# Patient Record
Sex: Female | Born: 1950 | Race: Black or African American | Hispanic: No | Marital: Married | Smoking: Former smoker
Health system: Southern US, Community
[De-identification: ages and names within clinical notes are randomized; demographics above are authoritative.]

## PROBLEM LIST (undated history)

## (undated) DIAGNOSIS — M549 Dorsalgia, unspecified: Secondary | ICD-10-CM

## (undated) DIAGNOSIS — G4730 Sleep apnea, unspecified: Secondary | ICD-10-CM

## (undated) DIAGNOSIS — T8859XA Other complications of anesthesia, initial encounter: Secondary | ICD-10-CM

## (undated) DIAGNOSIS — M1990 Unspecified osteoarthritis, unspecified site: Secondary | ICD-10-CM

## (undated) DIAGNOSIS — I10 Essential (primary) hypertension: Secondary | ICD-10-CM

## (undated) DIAGNOSIS — E785 Hyperlipidemia, unspecified: Secondary | ICD-10-CM

## (undated) HISTORY — PX: BREAST EXCISIONAL BIOPSY: SUR124

## (undated) HISTORY — PX: BACK SURGERY: SHX140

---

## 2007-09-07 ENCOUNTER — Encounter: Admission: RE | Admit: 2007-09-07 | Discharge: 2007-09-07 | Payer: Self-pay | Admitting: *Deleted

## 2007-10-14 ENCOUNTER — Encounter: Admission: RE | Admit: 2007-10-14 | Discharge: 2007-10-14 | Payer: Self-pay | Admitting: Interventional Cardiology

## 2007-10-31 ENCOUNTER — Encounter: Admission: RE | Admit: 2007-10-31 | Discharge: 2007-10-31 | Payer: Self-pay | Admitting: Obstetrics and Gynecology

## 2007-12-07 ENCOUNTER — Encounter: Admission: RE | Admit: 2007-12-07 | Discharge: 2007-12-07 | Payer: Self-pay | Admitting: Gastroenterology

## 2008-04-23 ENCOUNTER — Other Ambulatory Visit: Admission: RE | Admit: 2008-04-23 | Discharge: 2008-04-23 | Payer: Self-pay | Admitting: Obstetrics and Gynecology

## 2008-10-25 ENCOUNTER — Encounter: Admission: RE | Admit: 2008-10-25 | Discharge: 2008-10-25 | Payer: Self-pay | Admitting: Pediatrics

## 2009-11-11 ENCOUNTER — Encounter: Admission: RE | Admit: 2009-11-11 | Discharge: 2009-11-11 | Payer: Self-pay | Admitting: Pediatrics

## 2009-11-27 IMAGING — US US SOFT TISSUE HEAD/NECK
1 series · 14 of 25 positions shown · non-contrast
Comparison: none

CLINICAL DATA: Follow-up left thyroid nodule noted on CT of the chest on 10/14/07.
 THYROID ULTRASOUND:
TECHNIQUE: Ultrasound examination of the thyroid gland and adjacent soft tissue structures was performed.

[Series 1: us soft tissue head/neck · 0.09mm/px · 14 of 51 slices shown]
[im 1/51]
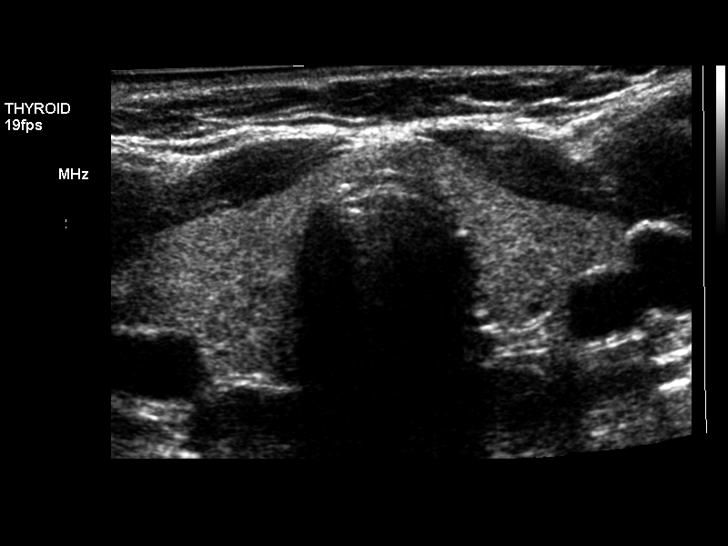
[im 5/51]
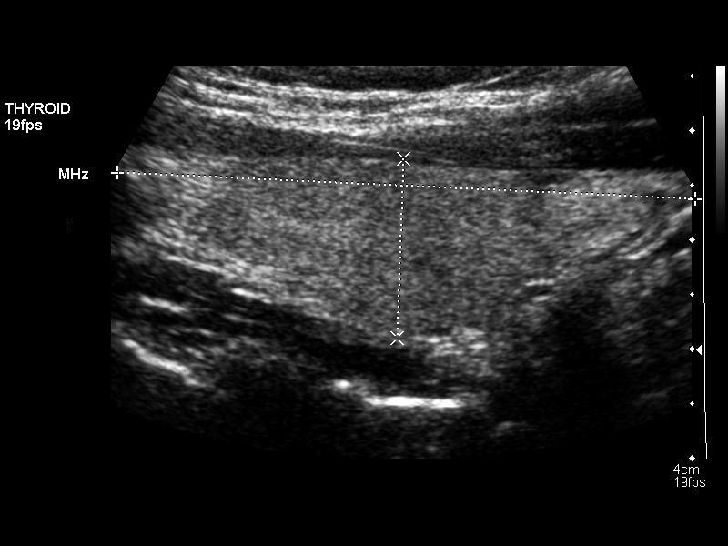
[im 9/51]
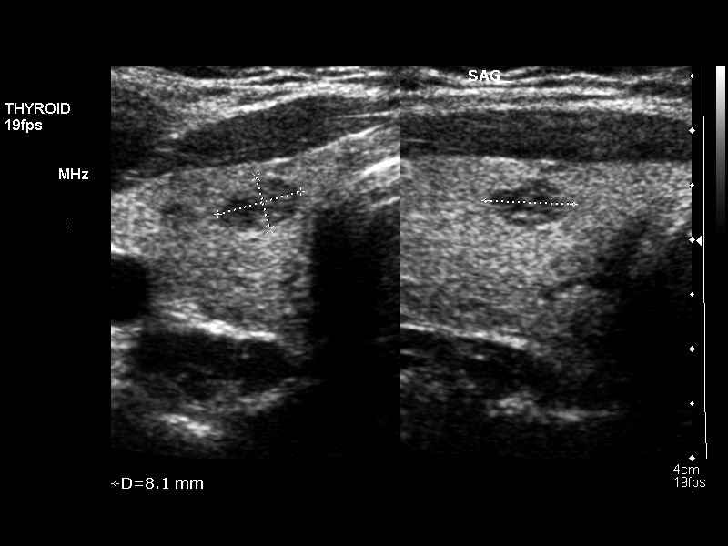
[im 13/51]
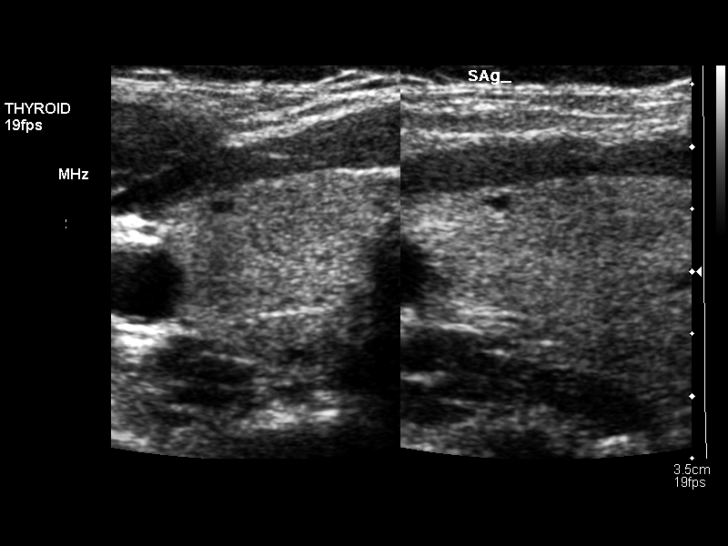
[im 17/51]
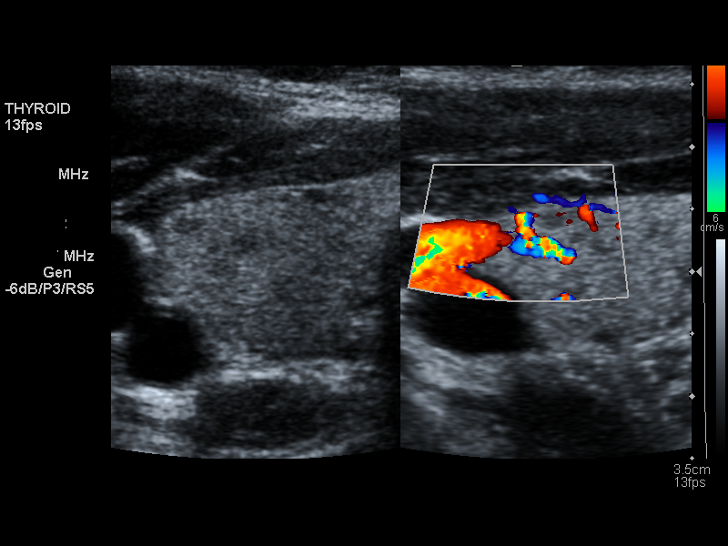
[im 19/51]
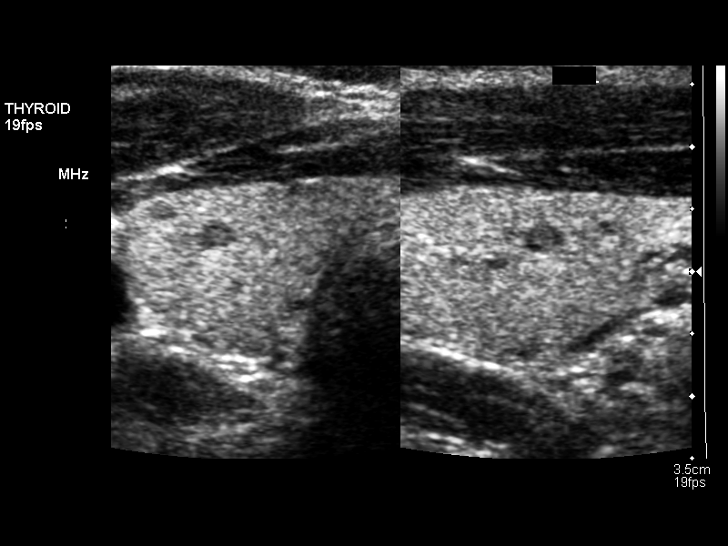
[im 23/51]
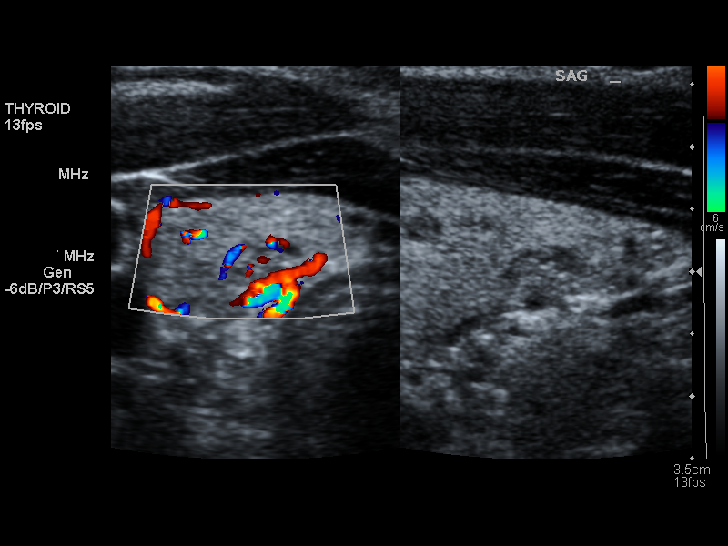
[im 28/51]
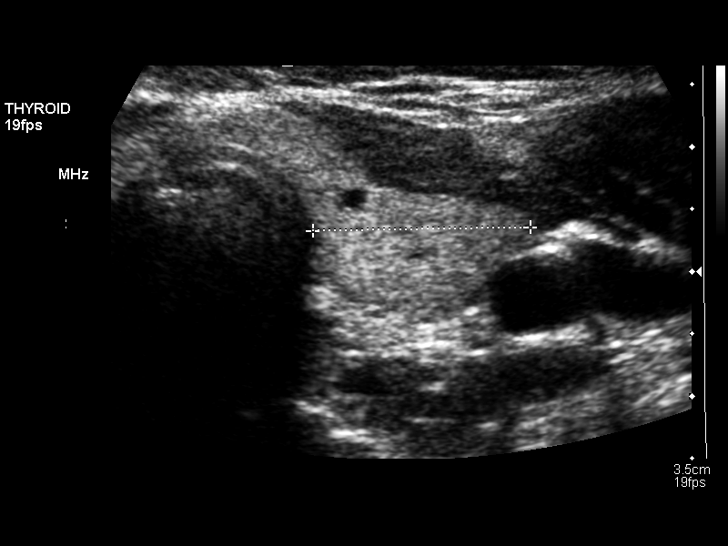
[im 32/51]
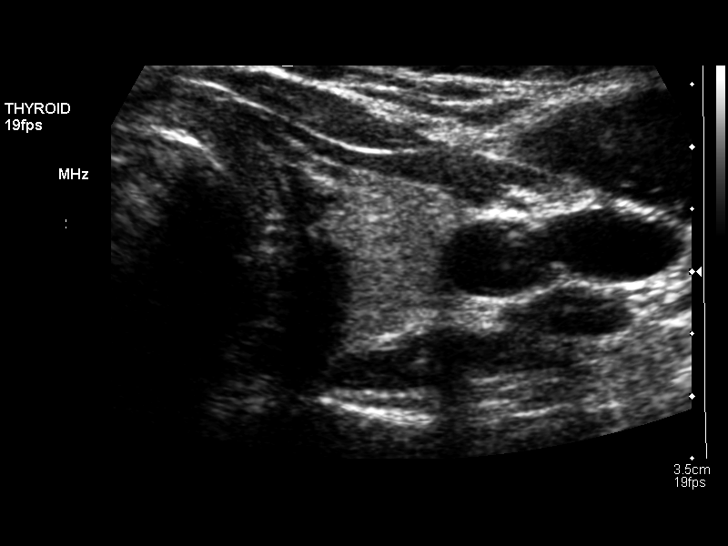
[im 34/51]
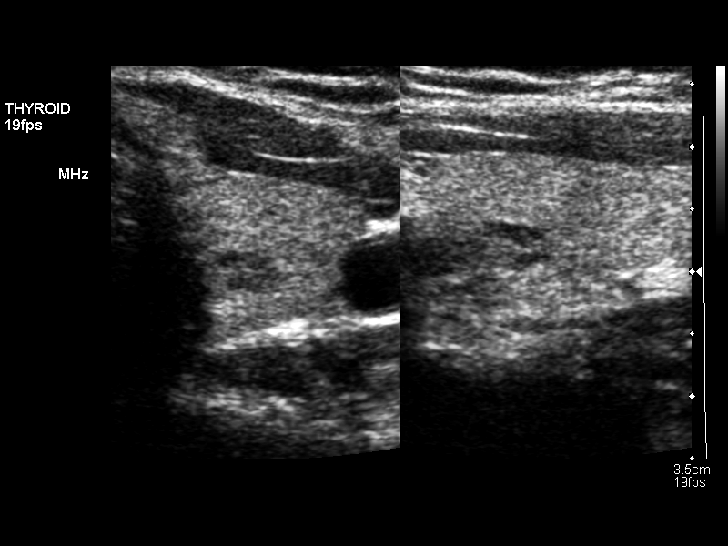
[im 38/51]
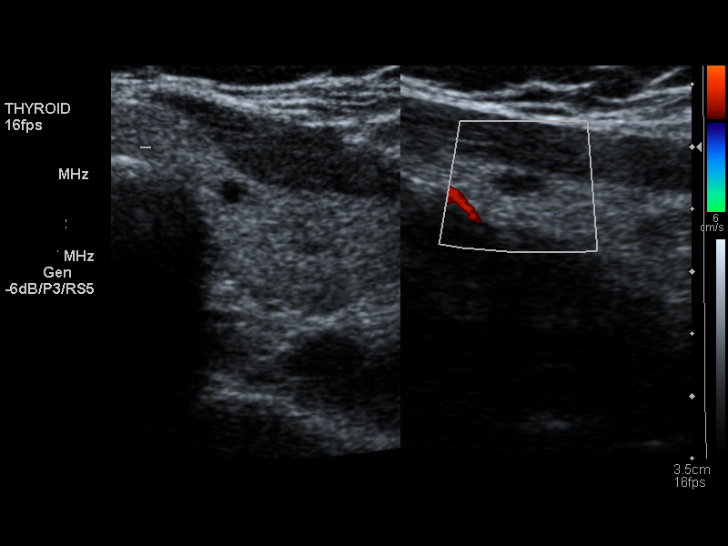
[im 42/51]
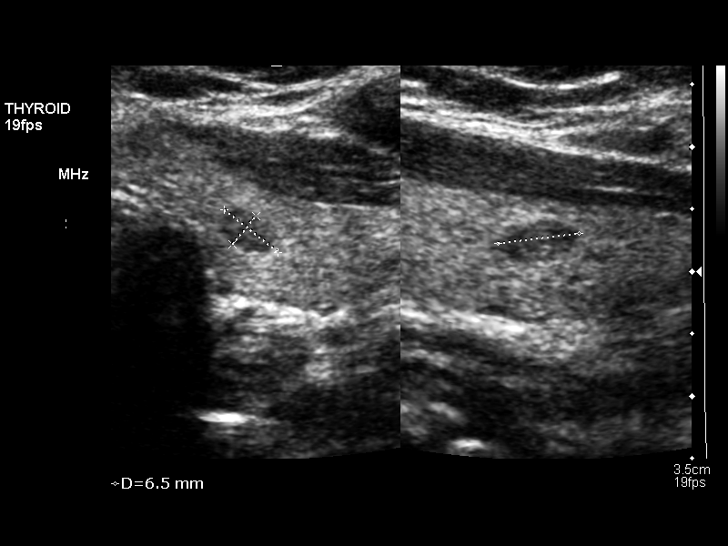
[im 46/51]
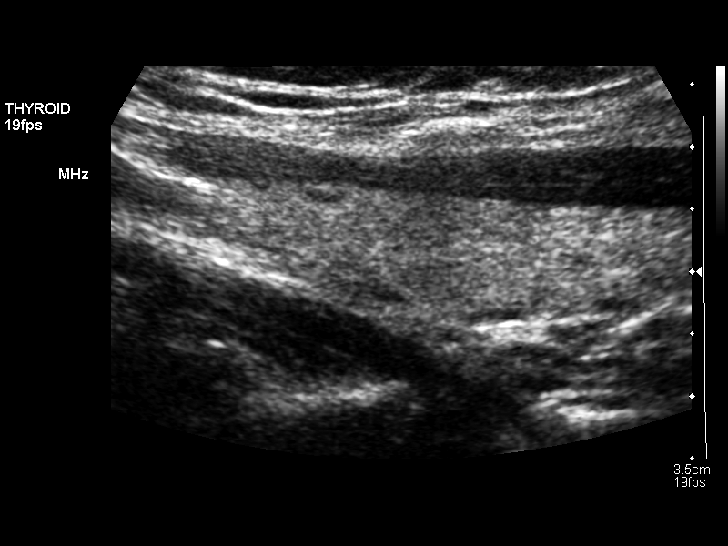
[im 51/51]
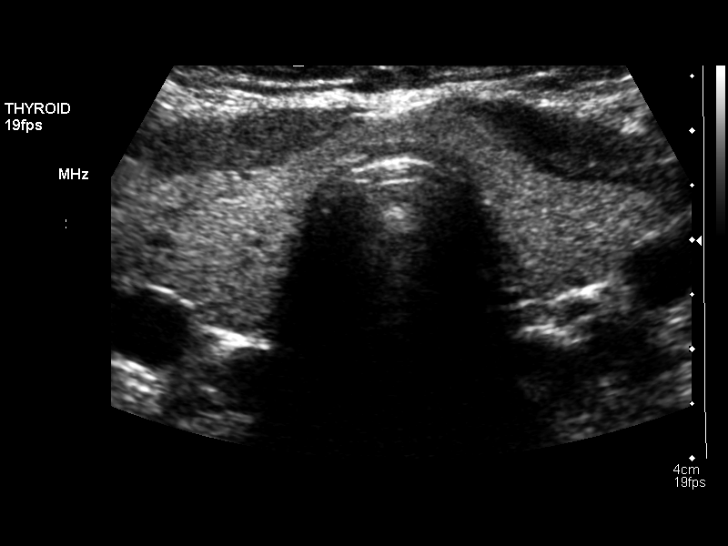

[14 of 25 positions shown; findings below may reference images not displayed]

FINDINGS: A low attenuation nodule was noted in the lower pole of the left thyroid on CT of the chest.  The thyroid is within normal limits in size.  The right lobe measures 5.3 cm sagittally with a depth of 1.6 cm and width of 1.9 cm.  The left lobe measures 4.7 x 0.9 x 1.7 cm with isthmus measuring 3.5 mm.  Small nodules are noted bilaterally.  One of the larger nodules is in the lower pole on the right and is solid measuring 8 x 5 x 8 mm with another nodule in the right mid lobe of 8 x 5 x 8 mm.  Another nodule, which is solid, is in the lower pole on the left of 6 x 3 x 7 with smaller nodules scattered bilaterally.
IMPRESSION: Multiple small nodules, none larger than 8 mm.  The thyroid gland is within normal limits in size.

## 2010-12-10 ENCOUNTER — Other Ambulatory Visit: Payer: Self-pay | Admitting: Pediatrics

## 2010-12-10 DIAGNOSIS — Z1231 Encounter for screening mammogram for malignant neoplasm of breast: Secondary | ICD-10-CM

## 2010-12-11 ENCOUNTER — Ambulatory Visit
Admission: RE | Admit: 2010-12-11 | Discharge: 2010-12-11 | Disposition: A | Discharge status: Home / Self Care | Payer: BC Managed Care – PPO | Source: Ambulatory Visit | Attending: Pediatrics | Admitting: Pediatrics

## 2010-12-11 DIAGNOSIS — Z1231 Encounter for screening mammogram for malignant neoplasm of breast: Secondary | ICD-10-CM

## 2012-01-27 ENCOUNTER — Other Ambulatory Visit: Payer: Self-pay | Admitting: Pediatrics

## 2012-01-27 DIAGNOSIS — Z1231 Encounter for screening mammogram for malignant neoplasm of breast: Secondary | ICD-10-CM

## 2012-02-08 ENCOUNTER — Ambulatory Visit: Payer: BC Managed Care – PPO

## 2012-02-09 ENCOUNTER — Ambulatory Visit
Admission: RE | Admit: 2012-02-09 | Discharge: 2012-02-09 | Disposition: A | Discharge status: Home / Self Care | Payer: BC Managed Care – PPO | Source: Ambulatory Visit | Attending: Pediatrics | Admitting: Pediatrics

## 2012-02-09 DIAGNOSIS — Z1231 Encounter for screening mammogram for malignant neoplasm of breast: Secondary | ICD-10-CM

## 2012-11-08 ENCOUNTER — Encounter (HOSPITAL_COMMUNITY): Payer: Self-pay | Admitting: *Deleted

## 2012-11-08 ENCOUNTER — Emergency Department (HOSPITAL_COMMUNITY): Payer: BC Managed Care – PPO

## 2012-11-08 ENCOUNTER — Emergency Department (HOSPITAL_COMMUNITY)
Admission: EM | Admit: 2012-11-08 | Discharge: 2012-11-08 | Disposition: A | Discharge status: Home / Self Care | Payer: BC Managed Care – PPO | Attending: Emergency Medicine | Admitting: Emergency Medicine

## 2012-11-08 DIAGNOSIS — S82899A Other fracture of unspecified lower leg, initial encounter for closed fracture: Secondary | ICD-10-CM | POA: Insufficient documentation

## 2012-11-08 DIAGNOSIS — S8261XA Displaced fracture of lateral malleolus of right fibula, initial encounter for closed fracture: Secondary | ICD-10-CM

## 2012-11-08 DIAGNOSIS — Y929 Unspecified place or not applicable: Secondary | ICD-10-CM | POA: Insufficient documentation

## 2012-11-08 DIAGNOSIS — W010XXA Fall on same level from slipping, tripping and stumbling without subsequent striking against object, initial encounter: Secondary | ICD-10-CM | POA: Insufficient documentation

## 2012-11-08 DIAGNOSIS — F17200 Nicotine dependence, unspecified, uncomplicated: Secondary | ICD-10-CM | POA: Insufficient documentation

## 2012-11-08 DIAGNOSIS — Y9301 Activity, walking, marching and hiking: Secondary | ICD-10-CM | POA: Insufficient documentation

## 2012-11-08 MED ORDER — HYDROCODONE-ACETAMINOPHEN 5-325 MG PO TABS
1.0000 | ORAL_TABLET | Freq: Once | ORAL | Status: AC
  Administered 2012-11-08: 1 via ORAL
  Filled 2012-11-08: qty 1

## 2012-11-08 MED ORDER — HYDROCODONE-ACETAMINOPHEN 5-325 MG PO TABS: 1.0000 | ORAL_TABLET | ORAL | Status: DC | PRN

## 2012-11-08 NOTE — ED Notes (Signed)
Pt states that she was walking on the sidewalk and she "slipped on the ice".  She reports hearing a "pop" sound and was unable to ambulate post fall.  + swelling noted to R ankle.  Ice pack applied.

## 2012-11-08 NOTE — ED Notes (Signed)
Valerie Chung with Ortho on her way.

## 2012-11-08 NOTE — ED Notes (Signed)
Patient transported to X-ray 

## 2012-11-08 NOTE — Progress Notes (Signed)
Orthopedic Tech Progress Note Patient Details:  Valerie Chung [birth year] [id number]  Ortho Devices Type of Ortho Device: Ace wrap;Stirrup splint;Short leg splint Ortho Device/Splint Location: RIGHT POSTERIOR WITH STIRRUP SPLINT AND CRUTCHES Ortho Device/Splint Interventions: Application   Cammer, Mickie Bail 11/08/2012, 9:19 AM

## 2012-11-08 NOTE — ED Notes (Signed)
Ortho tec paged 

## 2012-11-08 NOTE — ED Notes (Signed)
Ortho tec at bedside 

## 2012-11-08 NOTE — ED Provider Notes (Addendum)
History     CSN: [id number]  Arrival date & time 11/08/12  0717   First MD Initiated Contact with Patient 11/08/12 0719      Chief Complaint  Patient presents with  . Ankle Pain    (Consider location/radiation/quality/duration/timing/severity/associated sxs/prior treatment) Patient is a 62 y.o. female presenting with ankle pain. The history is provided by the patient. No language interpreter was used.  Ankle Pain Location:  Ankle Time since incident:  1 hour Injury: yes   Mechanism of injury: fall   Mechanism of injury comment:  Pt slipped on ice and fell, injuring her right ankle.  She was unable to stand and walk afterwards. Fall:    Impact surface:  Concrete   Point of impact: Twisted her right ankle.   Entrapped after fall: no   Pain details:    Quality:  Aching   Radiates to:  Does not radiate   Severity:  Moderate   Onset quality:  Sudden   Duration:  1 hour   Timing:  Constant   Progression:  Unchanged Chronicity:  New Dislocation: no   Foreign body present:  No foreign bodies Prior injury to area:  No Relieved by:  Nothing Worsened by:  Nothing tried   No past medical history on file.  No past surgical history on file.  No family history on file.  History  Substance Use Topics  . Smoking status: Light Tobacco Smoker  . Smokeless tobacco: Not on file  . Alcohol Use: Yes     Comment: occasional    OB History   Grav Para Term Preterm Abortions TAB SAB Ect Mult Living                  Review of Systems  All other systems reviewed and are negative.    Allergies  Review of patient's allergies indicates not on file.  Home Medications  No current outpatient prescriptions on file.  BP 157/92  Pulse 65  Temp(Src) 98.1 F (36.7 C) (Oral)  Physical Exam  Nursing note and vitals reviewed. Constitutional: She appears well-developed and well-nourished. Distressed: in moderate distress with right ankle pain.  Musculoskeletal:  He has swelling  over the lateral malleolus of the right ankle. Skin is intact she has intact pulses sensation and tendon function in the right foot.  Skin: Skin is warm and dry.  Psychiatric: She has a normal mood and affect. Her behavior is normal.    ED Course  Procedures (including critical care time)  7:35 AM Patient was seen and had physical examination. X-rays the right ankle were ordered. The patient did not want pain medication at present.  8:28 AM No results found for this or any previous visit. Dg Ankle Complete Right  11/08/2012  *RADIOLOGY REPORT*  Clinical Data: History of injury from fall with pain.  Soft tissue swelling involving lateral aspect.  RIGHT ANKLE - COMPLETE 3+ VIEW  Comparison: None.  Findings: There is lateral soft tissue swelling.  Oblique fracture of the distal diaphysis and metaphysis of the fibula is present. There is very slight displacement laterally of the distal fracture fragment.  Alignment is near anatomic.  There is near apposition at the fracture site.  Mortise is preserved.  There is degenerative spurring of the distal aspect of the tibia.  Posterior and plantar calcaneal spurring is present.  There is irregularity of the distal and lateral aspect of the medial malleolus of the distal tibia which consistent with a tiny chip fracture.  IMPRESSION:  Fracture of distal metaphysis and diaphysis of fibula.  Tiny chip fracture of medial malleolus of distal tibia.  No dislocation evident.  Degenerative spurring of distal tibia.  Calcaneal spurring.   Original Report Authenticated By: Onalee Hua Call     X-rays show nondisplaced fracture of the right lateral malleolus.  Rx splint, crutches, hydrocodone-acetaminophen q4h prn pain. F/U with [facility], orthopedists of her choice.    1. Fracture of lateral malleolus of right ankle, closed, initial encounter           Carleene Cooper III, MD 11/08/12 1610     Carleene Cooper III, MD 11/29/12 1901

## 2013-01-19 ENCOUNTER — Other Ambulatory Visit: Payer: Self-pay | Admitting: Obstetrics and Gynecology

## 2013-01-19 ENCOUNTER — Other Ambulatory Visit (HOSPITAL_COMMUNITY)
Admission: RE | Admit: 2013-01-19 | Discharge: 2013-01-19 | Disposition: A | Discharge status: Home / Self Care | Payer: BC Managed Care – PPO | Source: Ambulatory Visit | Attending: Obstetrics and Gynecology | Admitting: Obstetrics and Gynecology

## 2013-01-19 DIAGNOSIS — Z1151 Encounter for screening for human papillomavirus (HPV): Secondary | ICD-10-CM | POA: Insufficient documentation

## 2013-01-19 DIAGNOSIS — Z01419 Encounter for gynecological examination (general) (routine) without abnormal findings: Secondary | ICD-10-CM | POA: Insufficient documentation

## 2013-03-14 ENCOUNTER — Other Ambulatory Visit: Payer: Self-pay

## 2013-04-18 ENCOUNTER — Other Ambulatory Visit: Payer: Self-pay

## 2013-04-18 DIAGNOSIS — Z1231 Encounter for screening mammogram for malignant neoplasm of breast: Secondary | ICD-10-CM

## 2013-05-05 ENCOUNTER — Ambulatory Visit
Admission: RE | Admit: 2013-05-05 | Discharge: 2013-05-05 | Disposition: A | Discharge status: Home / Self Care | Payer: BC Managed Care – PPO | Source: Ambulatory Visit

## 2013-05-05 DIAGNOSIS — Z1231 Encounter for screening mammogram for malignant neoplasm of breast: Secondary | ICD-10-CM

## 2014-04-06 ENCOUNTER — Other Ambulatory Visit: Payer: Self-pay | Admitting: Family Medicine

## 2014-04-06 ENCOUNTER — Ambulatory Visit
Admission: RE | Admit: 2014-04-06 | Discharge: 2014-04-06 | Disposition: A | Discharge status: Home / Self Care | Payer: PRIVATE HEALTH INSURANCE | Source: Ambulatory Visit | Attending: Family Medicine | Admitting: Family Medicine

## 2014-04-06 DIAGNOSIS — R519 Headache, unspecified: Secondary | ICD-10-CM

## 2014-04-06 DIAGNOSIS — R51 Headache: Principal | ICD-10-CM

## 2014-07-03 ENCOUNTER — Other Ambulatory Visit: Payer: Self-pay

## 2014-07-03 DIAGNOSIS — Z1239 Encounter for other screening for malignant neoplasm of breast: Secondary | ICD-10-CM

## 2014-07-04 ENCOUNTER — Encounter (INDEPENDENT_AMBULATORY_CARE_PROVIDER_SITE_OTHER): Payer: Self-pay

## 2014-07-04 ENCOUNTER — Ambulatory Visit
Admission: RE | Admit: 2014-07-04 | Discharge: 2014-07-04 | Disposition: A | Discharge status: Home / Self Care | Payer: PRIVATE HEALTH INSURANCE | Source: Ambulatory Visit

## 2014-07-04 DIAGNOSIS — Z1239 Encounter for other screening for malignant neoplasm of breast: Secondary | ICD-10-CM

## 2014-11-26 ENCOUNTER — Emergency Department (INDEPENDENT_AMBULATORY_CARE_PROVIDER_SITE_OTHER): Payer: PRIVATE HEALTH INSURANCE

## 2014-11-26 ENCOUNTER — Encounter: Payer: Self-pay | Admitting: Emergency Medicine

## 2014-11-26 ENCOUNTER — Emergency Department (INDEPENDENT_AMBULATORY_CARE_PROVIDER_SITE_OTHER)
Admission: EM | Admit: 2014-11-26 | Discharge: 2014-11-26 | Disposition: A | Discharge status: Home / Self Care | Payer: PRIVATE HEALTH INSURANCE | Source: Home / Self Care | Attending: Family Medicine | Admitting: Family Medicine

## 2014-11-26 DIAGNOSIS — M545 Low back pain, unspecified: Secondary | ICD-10-CM

## 2014-11-26 DIAGNOSIS — M5417 Radiculopathy, lumbosacral region: Secondary | ICD-10-CM

## 2014-11-26 HISTORY — DX: Dorsalgia, unspecified: M54.9

## 2014-11-26 MED ORDER — PREDNISONE 20 MG PO TABS: 20.0000 mg | ORAL_TABLET | Freq: Two times a day (BID) | ORAL | Status: DC

## 2014-11-26 MED ORDER — KETOROLAC TROMETHAMINE 60 MG/2ML IM SOLN: 60.0000 mg | Freq: Once | INTRAMUSCULAR | Status: DC

## 2014-11-26 MED ORDER — HYDROCODONE-ACETAMINOPHEN 5-325 MG PO TABS: 1.0000 | ORAL_TABLET | Freq: Four times a day (QID) | ORAL | Status: DC | PRN

## 2014-11-26 NOTE — ED Notes (Signed)
Patient reports onset of sharp pains in right hip without injury/incident yesterday; had previously had sciatic nerve pain in left hip and feel this is similar; had Mobic and this did not help; finds walking very painful and came in on crutches.

## 2014-11-26 NOTE — ED Provider Notes (Signed)
CSN: [id number]     Arrival date & time 11/26/14  1117 History   First MD Initiated Contact with Patient 11/26/14 1232     Chief Complaint  Patient presents with  . Hip Pain      HPI Comments: Three days ago patient developed sharp aching pain in her left buttock that intermittently radiated to her left posterior thigh and calf, with occasional tingling in her left foot/ankle.  Yesterday she developed similar pain in her right buttock radiating to her right posterior thigh and calf.  The pain is somewhat better when leaning forward.  She denies bowel or bladder dysfunction, and no saddle numbness.  Her pain has not responded to Mobic.  She recalls no recent injury or change in physical activities. She has a history of bulging disc at L4-5.  Four years ago her similar back pain resolved after epidural steroid injection by her neurologist.       Patient is a 64 y.o. female presenting with back pain. The history is provided by the patient and the spouse.  Back Pain Location:  Lumbar spine and gluteal region Quality:  Stabbing and aching Radiates to:  L posterior upper leg, R posterior upper leg, L knee and R knee Pain severity:  Moderate Pain is:  Same all the time Onset quality:  Gradual Duration:  4 days Timing:  Constant Progression:  Worsening Chronicity:  Recurrent Context: not lifting heavy objects, not recent illness and not recent injury   Relieved by:  Nothing Worsened by:  Ambulation, bending and twisting Ineffective treatments:  NSAIDs Associated symptoms: leg pain, paresthesias and tingling   Associated symptoms: no abdominal pain, no abdominal swelling, no bladder incontinence, no bowel incontinence, no dysuria, no fever, no headaches, no numbness, no pelvic pain, no perianal numbness, no weakness and no weight loss   Risk factors: obesity     Past Medical History  Diagnosis Date  . Back pain     low   Past Surgical History  Procedure Laterality Date  . Back surgery      Family History  Problem Relation Age of Onset  . Cancer Mother    History  Substance Use Topics  . Smoking status: Former Games developer  . Smokeless tobacco: Not on file  . Alcohol Use: Yes     Comment: occasional   OB History    No data available     Review of Systems  Constitutional: Negative for fever and weight loss.  Gastrointestinal: Negative for abdominal pain and bowel incontinence.  Genitourinary: Negative for bladder incontinence, dysuria and pelvic pain.  Musculoskeletal: Positive for back pain.  Neurological: Positive for tingling and paresthesias. Negative for weakness, numbness and headaches.  All other systems reviewed and are negative.   Allergies  Review of patient's allergies indicates no known allergies.  Home Medications   Prior to Admission medications   Medication Sig Start Date End Date Taking? Authorizing Provider  aspirin 81 MG tablet Take 81 mg by mouth daily.   Yes Historical Provider, MD  meloxicam (MOBIC) 15 MG tablet Take 15 mg by mouth as needed for pain.   Yes Historical Provider, MD  HYDROcodone-acetaminophen (NORCO/VICODIN) 5-325 MG per tablet Take 1 tablet by mouth every 6 (six) hours as needed. 11/26/14   Lattie Haw, MD  predniSONE (DELTASONE) 20 MG tablet Take 1 tablet (20 mg total) by mouth 2 (two) times daily. Take with food. 11/26/14   Lattie Haw, MD   BP 128/84 mmHg  Pulse  68  Temp(Src) 98.2 F (36.8 C) (Oral)  Resp 16  Ht 5\' 9"  (1.753 m)  Wt 235 lb (106.595 kg)  BMI 34.69 kg/m2  SpO2 97% Physical Exam  Constitutional: She is oriented to person, place, and time. She appears well-developed and well-nourished. No distress.  Patient is obese (BMI 34.7).  She has difficulty ambulating, using crutches  HENT:  Head: Normocephalic.  Mouth/Throat: Oropharynx is clear and moist.  Eyes: Conjunctivae are normal. Pupils are equal, round, and reactive to light.  Neck: Normal range of motion.  Cardiovascular: Normal heart sounds.     Pulmonary/Chest: Breath sounds normal.  Abdominal: Bowel sounds are normal. There is no tenderness.  Musculoskeletal:       Lumbar back: She exhibits tenderness and bony tenderness.       Back:  Back:   Decreased range of motion.  She is able to squat.  Tenderness in the midline and bilateral paraspinous muscles from L3 to Sacral area; also tenderness to palpation over SI joints.  Straight leg raising test is positive on the right at 60 degrees.  Sitting knee extension test is negative.  Strength and sensation in the lower extremities is normal.  Patellar and achilles reflexes are normal in the left lower extremity.  Right patellar reflex is elicited with augmentation.  Neurological: She is alert and oriented to person, place, and time.  Skin: Skin is warm and dry.  Nursing note and vitals reviewed.   ED Course  Procedures  none    Imaging Review Dg Lumbar Spine Complete  11/26/2014   CLINICAL DATA:  Low back pain for 3 days with bilateral radiculopathy  EXAM: LUMBAR SPINE - COMPLETE 4+ VIEW  COMPARISON:  None.  FINDINGS: Five views of lumbar spine submitted. No acute fracture or subluxation. Minimal dextroscoliosis. There is mild disc space flattening with mild anterior spurring at L4-L5 and L5-S1 level. Facet degenerative changes L4 and L5 level.  IMPRESSION: No acute fracture or subluxation. Mild disc space flattening at L4-L5 and L5-S1 level.   Electronically Signed   By: Natasha MeadLiviu  Pop M.D.   On: 11/26/2014 13:30     MDM   1. Radiculopathy of lumbosacral region   2. Acute low back pain    Toradol 60mg  IM Begin prednisone burst.  Lortab for pain. Apply ice pack for 20 to 30 minutes, 3 to 4 times daily  Continue until pain decreases.  Avoid lifting. Followup with neurologist or Dr. Rodney Langtonhomas Thekkekandam as soon as possible.     Lattie HawStephen A Beese, MD 11/27/14 [phone]

## 2014-11-26 NOTE — Discharge Instructions (Signed)
Apply ice pack for 20 to 30 minutes, 3 to 4 times daily  Continue until pain decreases.  Avoid lifting.   Lumbosacral Radiculopathy Lumbosacral radiculopathy is a pinched nerve or nerves in the low back (lumbosacral area). When this happens you may have weakness in your legs and may not be able to stand on your toes. You may have pain going down into your legs. There may be difficulties with walking normally. There are many causes of this problem. Sometimes this may happen from an injury, or simply from arthritis or boney problems. It may also be caused by other illnesses such as diabetes. If there is no improvement after treatment, further studies may be done to find the exact cause. DIAGNOSIS  X-rays may be needed if the problems become long standing. Electromyograms may be done. This study is one in which the working of nerves and muscles is studied. HOME CARE INSTRUCTIONS   Applications of ice packs may be helpful. Ice can be used in a plastic bag with a towel around it to prevent frostbite to skin. This may be used every 2 hours for 20 to 30 minutes, or as needed, while awake, or as directed by your caregiver.  Only take over-the-counter or prescription medicines for pain, discomfort, or fever as directed by your caregiver.  If physical therapy was prescribed, follow your caregiver's directions. SEEK IMMEDIATE MEDICAL CARE IF:   You have pain not controlled with medications.  You seem to be getting worse rather than better.  You develop increasing weakness in your legs.  You develop loss of bowel or bladder control.  You have difficulty with walking or balance, or develop clumsiness in the use of your legs.  You have a fever. MAKE SURE YOU:   Understand these instructions.  Will watch your condition.  Will get help right away if you are not doing well or get worse. Document Released: 09/07/2005 Document Revised: 11/30/2011 Document Reviewed: 04/27/2008 [facility] Patient  Information 2015 [place], [place]. This information is not intended to replace advice given to you by your health care provider. Make sure you discuss any questions you have with your health care provider.

## 2015-01-28 ENCOUNTER — Other Ambulatory Visit (HOSPITAL_COMMUNITY): Payer: Self-pay | Admitting: Obstetrics and Gynecology

## 2015-01-28 ENCOUNTER — Other Ambulatory Visit (HOSPITAL_COMMUNITY)
Admission: RE | Admit: 2015-01-28 | Discharge: 2015-01-28 | Disposition: A | Discharge status: Home / Self Care | Payer: PRIVATE HEALTH INSURANCE | Source: Ambulatory Visit | Attending: Obstetrics and Gynecology | Admitting: Obstetrics and Gynecology

## 2015-01-28 ENCOUNTER — Other Ambulatory Visit: Payer: Self-pay | Admitting: Obstetrics and Gynecology

## 2015-01-28 DIAGNOSIS — Z01419 Encounter for gynecological examination (general) (routine) without abnormal findings: Secondary | ICD-10-CM | POA: Diagnosis not present

## 2015-01-28 DIAGNOSIS — N644 Mastodynia: Secondary | ICD-10-CM

## 2015-01-30 LAB — CYTOLOGY - PAP

## 2015-02-07 ENCOUNTER — Other Ambulatory Visit: Payer: PRIVATE HEALTH INSURANCE

## 2015-02-15 ENCOUNTER — Ambulatory Visit
Admission: RE | Admit: 2015-02-15 | Discharge: 2015-02-15 | Disposition: A | Discharge status: Home / Self Care | Payer: PRIVATE HEALTH INSURANCE | Source: Ambulatory Visit | Attending: Obstetrics and Gynecology | Admitting: Obstetrics and Gynecology

## 2015-02-15 DIAGNOSIS — N644 Mastodynia: Secondary | ICD-10-CM

## 2015-06-24 ENCOUNTER — Other Ambulatory Visit: Payer: Self-pay

## 2015-06-24 DIAGNOSIS — Z1231 Encounter for screening mammogram for malignant neoplasm of breast: Secondary | ICD-10-CM

## 2015-07-11 ENCOUNTER — Ambulatory Visit
Admission: RE | Admit: 2015-07-11 | Discharge: 2015-07-11 | Disposition: A | Discharge status: Home / Self Care | Payer: PRIVATE HEALTH INSURANCE | Source: Ambulatory Visit

## 2015-07-11 DIAGNOSIS — Z1231 Encounter for screening mammogram for malignant neoplasm of breast: Secondary | ICD-10-CM

## 2016-04-09 ENCOUNTER — Other Ambulatory Visit (HOSPITAL_COMMUNITY)
Admission: RE | Admit: 2016-04-09 | Discharge: 2016-04-09 | Disposition: A | Discharge status: Home / Self Care | Payer: Medicare Other | Source: Ambulatory Visit | Attending: Obstetrics and Gynecology | Admitting: Obstetrics and Gynecology

## 2016-04-09 ENCOUNTER — Other Ambulatory Visit: Payer: Self-pay | Admitting: Obstetrics and Gynecology

## 2016-04-09 DIAGNOSIS — Z01419 Encounter for gynecological examination (general) (routine) without abnormal findings: Secondary | ICD-10-CM | POA: Insufficient documentation

## 2016-04-09 DIAGNOSIS — Z1151 Encounter for screening for human papillomavirus (HPV): Secondary | ICD-10-CM | POA: Diagnosis present

## 2016-04-10 ENCOUNTER — Other Ambulatory Visit: Payer: Self-pay | Admitting: Obstetrics and Gynecology

## 2016-04-10 DIAGNOSIS — N644 Mastodynia: Secondary | ICD-10-CM

## 2016-04-13 LAB — CYTOLOGY - PAP

## 2016-04-15 ENCOUNTER — Other Ambulatory Visit: Payer: PRIVATE HEALTH INSURANCE

## 2016-04-22 ENCOUNTER — Ambulatory Visit
Admission: RE | Admit: 2016-04-22 | Discharge: 2016-04-22 | Disposition: A | Discharge status: Home / Self Care | Payer: Medicare Other | Source: Ambulatory Visit | Attending: Obstetrics and Gynecology | Admitting: Obstetrics and Gynecology

## 2016-04-22 DIAGNOSIS — N644 Mastodynia: Secondary | ICD-10-CM

## 2016-05-18 ENCOUNTER — Other Ambulatory Visit: Payer: Self-pay | Admitting: Obstetrics and Gynecology

## 2016-05-18 DIAGNOSIS — Z1231 Encounter for screening mammogram for malignant neoplasm of breast: Secondary | ICD-10-CM

## 2016-07-13 ENCOUNTER — Ambulatory Visit
Admission: RE | Admit: 2016-07-13 | Discharge: 2016-07-13 | Disposition: A | Discharge status: Home / Self Care | Payer: Medicare Other | Source: Ambulatory Visit | Attending: Obstetrics and Gynecology | Admitting: Obstetrics and Gynecology

## 2016-07-13 DIAGNOSIS — Z1231 Encounter for screening mammogram for malignant neoplasm of breast: Secondary | ICD-10-CM

## 2016-10-30 DIAGNOSIS — E785 Hyperlipidemia, unspecified: Secondary | ICD-10-CM | POA: Insufficient documentation

## 2016-10-30 DIAGNOSIS — R079 Chest pain, unspecified: Secondary | ICD-10-CM | POA: Insufficient documentation

## 2016-10-30 DIAGNOSIS — I25119 Atherosclerotic heart disease of native coronary artery with unspecified angina pectoris: Secondary | ICD-10-CM | POA: Insufficient documentation

## 2016-12-18 ENCOUNTER — Ambulatory Visit: Payer: Self-pay | Admitting: Podiatry

## 2017-01-21 ENCOUNTER — Ambulatory Visit: Payer: Self-pay | Admitting: Podiatry

## 2017-03-25 ENCOUNTER — Ambulatory Visit (INDEPENDENT_AMBULATORY_CARE_PROVIDER_SITE_OTHER): Payer: Medicare Other | Admitting: Podiatry

## 2017-03-25 ENCOUNTER — Encounter: Payer: Self-pay | Admitting: Podiatry

## 2017-03-25 ENCOUNTER — Ambulatory Visit (INDEPENDENT_AMBULATORY_CARE_PROVIDER_SITE_OTHER): Payer: Medicare Other

## 2017-03-25 DIAGNOSIS — M722 Plantar fascial fibromatosis: Secondary | ICD-10-CM

## 2017-03-25 MED ORDER — METHYLPREDNISOLONE 4 MG PO TBPK: ORAL_TABLET | ORAL | 0 refills | Status: DC

## 2017-03-25 MED ORDER — MELOXICAM 15 MG PO TABS: 15.0000 mg | ORAL_TABLET | Freq: Every day | ORAL | 3 refills | Status: DC

## 2017-03-25 NOTE — Progress Notes (Signed)
   Subjective:    Patient ID: Valerie Chung, female    DOB: 03/16/1951, 66 y.o.   MRN: [id number]  HPI: She presents today with a 6 month duration of pain to the left heel. This been going on for quite some time but has just been persistent for the past 6 months. She states the mornings are equally bad and anytime after she's been sitting for a while and gets back up to ambulate.    Review of Systems  Musculoskeletal: Positive for arthralgias, back pain and joint swelling.  All other systems reviewed and are negative.      Objective:   Physical Exam: Vital signs are stable alert and oriented 3. Pulses are palpable. Neurologic sensorium is intact. Deep tendon reflexes are intact. Muscle strength is normal bilateral. Orthopedic evaluation demonstrates pain on palpation medial calcaneal tubercle of the left heel. Regress demonstrates soft tissue increase in density at the prior fascial cannula insertion site of the left heel no lesions or wounds are noted.        Assessment & Plan:  Assessment: Plantar fasciitis left.  Plan: Discussed etiology pathology conservative or surgical therapies. At this point she was given both oral and written home going instructions and care and stretching of her plantar fasciitis. Also wrote a prescription for Medrol Dosepak to be followed by meloxicam. Injected her left heel today with Kenalog and local anesthetic. Discussed appropriate shoe gear stretching exercises ice therapies U modifications. Myofascial strapping and a plantar brace dispensed.

## 2017-03-25 NOTE — Patient Instructions (Signed)

## 2017-04-27 ENCOUNTER — Encounter: Payer: Self-pay | Admitting: Podiatry

## 2017-04-27 ENCOUNTER — Ambulatory Visit (INDEPENDENT_AMBULATORY_CARE_PROVIDER_SITE_OTHER): Payer: Medicare Other | Admitting: Podiatry

## 2017-04-27 DIAGNOSIS — M722 Plantar fascial fibromatosis: Secondary | ICD-10-CM

## 2017-04-27 NOTE — Progress Notes (Signed)
She presents today for follow-up of plantar fasciitis states is doing much better. She continues utilized plantarflexion brace night splint and her medications.  Objective: She has no reproducible pain on palpation medially continue range of motion left foot. Pulses remain palpable pain.  Assessment: Bunion plantar fasciitis.  Plan: Continue all conservative therapies listed by follow-up with me as needed.

## 2017-08-20 ENCOUNTER — Other Ambulatory Visit: Payer: Self-pay | Admitting: Family Medicine

## 2017-08-20 DIAGNOSIS — Z1231 Encounter for screening mammogram for malignant neoplasm of breast: Secondary | ICD-10-CM

## 2017-09-22 ENCOUNTER — Ambulatory Visit
Admission: RE | Admit: 2017-09-22 | Discharge: 2017-09-22 | Disposition: A | Discharge status: Home / Self Care | Payer: Medicare Other | Source: Ambulatory Visit | Attending: Family Medicine | Admitting: Family Medicine

## 2017-09-22 DIAGNOSIS — Z1231 Encounter for screening mammogram for malignant neoplasm of breast: Secondary | ICD-10-CM

## 2018-08-12 ENCOUNTER — Other Ambulatory Visit: Payer: Self-pay | Admitting: Family Medicine

## 2018-08-12 DIAGNOSIS — Z1231 Encounter for screening mammogram for malignant neoplasm of breast: Secondary | ICD-10-CM

## 2018-09-26 ENCOUNTER — Ambulatory Visit
Admission: RE | Admit: 2018-09-26 | Discharge: 2018-09-26 | Disposition: A | Discharge status: Home / Self Care | Payer: Medicare Other | Source: Ambulatory Visit | Attending: Family Medicine | Admitting: Family Medicine

## 2018-09-26 DIAGNOSIS — Z1231 Encounter for screening mammogram for malignant neoplasm of breast: Secondary | ICD-10-CM

## 2019-02-07 ENCOUNTER — Other Ambulatory Visit: Payer: Self-pay | Admitting: Family Medicine

## 2019-02-07 DIAGNOSIS — E2839 Other primary ovarian failure: Secondary | ICD-10-CM

## 2019-04-17 ENCOUNTER — Other Ambulatory Visit: Payer: Self-pay

## 2019-04-17 ENCOUNTER — Ambulatory Visit
Admission: RE | Admit: 2019-04-17 | Discharge: 2019-04-17 | Disposition: A | Discharge status: Home / Self Care | Payer: Medicare Other | Source: Ambulatory Visit | Attending: Family Medicine | Admitting: Family Medicine

## 2019-04-17 DIAGNOSIS — E2839 Other primary ovarian failure: Secondary | ICD-10-CM

## 2019-09-13 ENCOUNTER — Other Ambulatory Visit: Payer: Self-pay | Admitting: Family Medicine

## 2019-09-13 DIAGNOSIS — Z1231 Encounter for screening mammogram for malignant neoplasm of breast: Secondary | ICD-10-CM

## 2019-10-27 ENCOUNTER — Ambulatory Visit: Payer: Medicare Other

## 2019-10-31 ENCOUNTER — Other Ambulatory Visit: Payer: Self-pay

## 2019-10-31 ENCOUNTER — Ambulatory Visit
Admission: RE | Admit: 2019-10-31 | Discharge: 2019-10-31 | Disposition: A | Discharge status: Home / Self Care | Payer: Medicare Other | Source: Ambulatory Visit | Attending: Family Medicine | Admitting: Family Medicine

## 2019-10-31 DIAGNOSIS — Z1231 Encounter for screening mammogram for malignant neoplasm of breast: Secondary | ICD-10-CM

## 2020-04-29 ENCOUNTER — Ambulatory Visit (HOSPITAL_COMMUNITY)
Admission: EM | Admit: 2020-04-29 | Discharge: 2020-04-29 | Disposition: A | Discharge status: Home / Self Care | Payer: Medicare Other

## 2020-04-29 ENCOUNTER — Ambulatory Visit (INDEPENDENT_AMBULATORY_CARE_PROVIDER_SITE_OTHER): Payer: Medicare Other

## 2020-04-29 ENCOUNTER — Other Ambulatory Visit: Payer: Self-pay

## 2020-04-29 ENCOUNTER — Encounter (HOSPITAL_COMMUNITY): Payer: Self-pay

## 2020-04-29 DIAGNOSIS — S86911A Strain of unspecified muscle(s) and tendon(s) at lower leg level, right leg, initial encounter: Secondary | ICD-10-CM

## 2020-04-29 DIAGNOSIS — M25561 Pain in right knee: Secondary | ICD-10-CM | POA: Diagnosis not present

## 2020-04-29 HISTORY — DX: Hyperlipidemia, unspecified: E78.5

## 2020-04-29 HISTORY — DX: Essential (primary) hypertension: I10

## 2020-04-29 MED ORDER — TRAMADOL HCL 50 MG PO TABS: 50.0000 mg | ORAL_TABLET | Freq: Four times a day (QID) | ORAL | 0 refills | Status: DC | PRN

## 2020-04-29 MED ORDER — PREDNISONE 20 MG PO TABS: 40.0000 mg | ORAL_TABLET | Freq: Every day | ORAL | 0 refills | Status: AC

## 2020-04-29 NOTE — Discharge Instructions (Addendum)
Wear knee brace x 5 days. May remove at bedtime and for showering. Start prednisone 40 mg tomorrow morning and take daily for a total of 5 days, Tramadol as needed for pain. Call orthopedics office listed if symptoms have not significantly improved in 3-5 days

## 2020-04-29 NOTE — ED Provider Notes (Signed)
[facility]    CSN: [id number] Arrival date & time: 04/29/20  1546      History   Chief Complaint Chief Complaint  Patient presents with   Knee Pain    HPI Valerie Chung is a 69 y.o. female.   HPI  Patient presents for evaluation of right knee pain following a fall at the beach yesterday and in which her full body weight landed on right knee following fall. No prior injuries to right knee. She has taken OTC analgesics without improvement of pain. She is not experiencing bruising or localized swelling of the knee. She endorses generalized knee swelling. Denies posterior knee pain, leg numbness or paranesthesia.      Past Medical History:  Diagnosis Date   Back pain    low   Hyperlipemia    Hypertension     Patient Active Problem List   Diagnosis Date Noted   Chest pain 10/30/2016   Coronary artery disease involving native coronary artery of native heart with angina pectoris (HCC) 10/30/2016   Dyslipidemia (high LDL; low HDL) 10/30/2016    Past Surgical History:  Procedure Laterality Date   BACK SURGERY     BREAST EXCISIONAL BIOPSY Right     OB History   No obstetric history on file.      Home Medications    Prior to Admission medications   Medication Sig Start Date End Date Taking? Authorizing Provider  aspirin EC 81 MG tablet Take 81 mg by mouth.   Yes [provider]  atorvastatin (LIPITOR) 40 MG tablet Take 40 mg by mouth daily. 02/27/20  Yes [provider]  gabapentin (NEURONTIN) 100 MG capsule  03/04/20  Yes [provider]  metoprolol succinate (TOPROL-XL) 25 MG 24 hr tablet Take 1 tablet by mouth daily. 06/16/19  Yes [provider]  meloxicam (MOBIC) 15 MG tablet Take 1 tablet (15 mg total) by mouth daily. 03/25/17   Hyatt, Annye Rusk, DPM    Family History Family History  Problem Relation Age of Onset   Cancer Mother     Social History Social History   Tobacco Use   Smoking status: Former  Smoker   Smokeless tobacco: Never Used  Substance Use Topics   Alcohol use: Yes    Comment: occasional   Drug use: Never     Allergies   Patient has no known allergies.  Review of Systems Review of Systems Pertinent negatives listed in HPI  Physical Exam Triage Vital Signs ED Triage Vitals  Enc Vitals Group     BP 04/29/20 1824 130/83     Pulse Rate 04/29/20 1824 62     Resp 04/29/20 1824 16     Temp 04/29/20 1824 98.3 F (36.8 C)     Temp Source 04/29/20 1824 Oral     SpO2 04/29/20 1824 98 %     Weight --      Height --      Head Circumference --      Peak Flow --      Pain Score 04/29/20 1831 5     Pain Loc --      Pain Edu? --      Excl. in GC? --    No data found.  Updated Vital Signs BP 130/83 (BP Location: Left Arm)    Pulse 62    Temp 98.3 F (36.8 C) (Oral)    Resp 16    SpO2 98%   Visual Acuity Right Eye Distance:  Left Eye Distance:   Bilateral Distance:    Right Eye Near:   Left Eye Near:    Bilateral Near:     Physical Exam General appearance: alert, well developed, well nourished, cooperative and in no distress Head: Normocephalic, without obvious abnormality, atraumatic Respiratory: Respirations even and unlabored, normal respiratory rate Heart: rate and rhythm normal. No gallop or murmurs noted on exam  Abdomen: BS +, no distention, no rebound tenderness, or no mass Right  Knee: no palpable effusion, MCL and ACL tenderness with deep palpation, limited ROM Skin: Skin color, texture, turgor normal. No rashes seen  Psych: Appropriate mood and affect. Neurologic: Mental status: Alert, oriented to person, place, and time, thought content appropriate.  UC Treatments / Results  Labs (all labs ordered are listed, but only abnormal results are displayed) Labs Reviewed - No data to display  EKG   Radiology DG Knee Complete 4 Views Right  Result Date: 04/29/2020 CLINICAL DATA:  Knee pain. EXAM: RIGHT KNEE - COMPLETE 4+ VIEW COMPARISON:   None. FINDINGS: There are tricompartmental degenerative changes. There is no acute displaced fracture. There is no dislocation. No significant joint effusion. IMPRESSION: 1. No acute displaced fracture or dislocation. 2. Tricompartmental degenerative changes. Electronically Signed   By: Katherine Mantle M.D.   On: 04/29/2020 19:24   Procedures Procedures (including critical care time)  Medications Ordered in UC Medications - No data to display  Initial Impression / Assessment and Plan / UC Course  I have reviewed the triage vital signs and the nursing notes.  Pertinent labs & imaging results that were available during my care of the patient were reviewed by me and considered in my medical decision making (see chart for details).   Per x-ray , no acute fracture, however chronic arthritic disease present per imaging. Will placed in a hinged immobilizer for right knee. Prescribe pain control and prednisone along with recommended follow-up with orthopedics if no improvement . Short course of Tramadol prescribed PRN for pain.  An After Visit Summary was printed and given to the patient/family. Precautions discussed. Red flags discussed. Questions invited and answered. They voiced understanding and agreement.   Final Clinical Impressions(s) / UC Diagnoses   Final diagnoses:  Strain of right knee, initial encounter     Discharge Instructions     Wear knee brace x 5 days. May remove at bedtime and for showering. Start prednisone 40 mg tomorrow morning and take daily for a total of 5 days, Tramadol as needed for pain. Call orthopedics office listed if symptoms have not significantly improved in 3-5 days     ED Prescriptions    Medication Sig Dispense Auth. Provider   traMADol (ULTRAM) 50 MG tablet Take 1 tablet (50 mg total) by mouth every 6 (six) hours as needed. 15 tablet Bing Neighbors, FNP   predniSONE (DELTASONE) 20 MG tablet Take 2 tablets (40 mg total) by mouth daily for  5 days. 10 tablet Bing Neighbors, FNP     PDMP not reviewed this encounter.   Bing Neighbors, FNP 05/02/20 2241

## 2020-04-29 NOTE — ED Triage Notes (Signed)
Pt c/o right knee pain 2/2 falling when a wave knocked pt over at beach twice yesterday. Pain increases with leg extension.  Denies numbness, tingling to toes. Neurovascular intact to feet/toes.  Took two Aleve at approx 1100 today.

## 2020-09-30 ENCOUNTER — Other Ambulatory Visit: Payer: Self-pay | Admitting: Family Medicine

## 2020-09-30 DIAGNOSIS — Z1231 Encounter for screening mammogram for malignant neoplasm of breast: Secondary | ICD-10-CM

## 2020-10-08 DIAGNOSIS — Z20822 Contact with and (suspected) exposure to covid-19: Secondary | ICD-10-CM | POA: Diagnosis not present

## 2020-10-27 ENCOUNTER — Other Ambulatory Visit: Payer: Self-pay

## 2020-10-27 ENCOUNTER — Ambulatory Visit (HOSPITAL_COMMUNITY)
Admission: EM | Admit: 2020-10-27 | Discharge: 2020-10-27 | Disposition: A | Discharge status: Home / Self Care | Payer: Medicare Other | Attending: Emergency Medicine | Admitting: Emergency Medicine

## 2020-10-27 ENCOUNTER — Encounter (HOSPITAL_COMMUNITY): Payer: Self-pay | Admitting: Emergency Medicine

## 2020-10-27 DIAGNOSIS — H8101 Meniere's disease, right ear: Secondary | ICD-10-CM

## 2020-10-27 DIAGNOSIS — H81391 Other peripheral vertigo, right ear: Secondary | ICD-10-CM

## 2020-10-27 MED ORDER — ONDANSETRON 4 MG PO TBDP
4.0000 mg | ORAL_TABLET | Freq: Once | ORAL | Status: AC
  Administered 2020-10-27: 4 mg via ORAL

## 2020-10-27 MED ORDER — ONDANSETRON 4 MG PO TBDP
ORAL_TABLET | ORAL | Status: AC
  Filled 2020-10-27: qty 1

## 2020-10-27 MED ORDER — MECLIZINE HCL 25 MG PO TABS: 25.0000 mg | ORAL_TABLET | Freq: Three times a day (TID) | ORAL | 0 refills | Status: DC | PRN

## 2020-10-27 MED ORDER — ONDANSETRON 4 MG PO TBDP: 4.0000 mg | ORAL_TABLET | Freq: Three times a day (TID) | ORAL | 0 refills | Status: DC | PRN

## 2020-10-27 NOTE — Discharge Instructions (Signed)
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 Suszanne Conners for hearing testing and further evaluation.

## 2020-10-27 NOTE — ED Triage Notes (Signed)
Pt presents with nausea, vomiting, and dizziness. States dizziness started yesterday and woke up this am feeling much worse.  Denies any SOB or chest pain.

## 2020-10-27 NOTE — ED Provider Notes (Signed)
HPI  SUBJECTIVE:  Valerie Chung is a 70 y.o. female who presents with intense episodes of dizziness described as "the room spinning around me" with nausea vomiting starting yesterday.  lasted several hours and resolved.  Woke up with it again this morning.  Today's episode has lasted about 2 hours.  She reports nonpulsatile tinnitus in the right ear for the past week.  No headache, double vision, visual loss, arm or leg weakness, slurred speech, facial droop, chest pain, shortness of breath, palpitations.  No recent viral illness, current URI symptoms.  No history of otitis media, change in medications, ear pain, recent antibiotics.  No hearing loss.  She has never had symptoms like this before.  She tried closing her eyes with improvement in her vertigo.  Symptoms are worse with her eyes being open, turning her head, turning over in bed.  She has a past medical history of coronary disease and hypercholesterolemia.  No history of Mnire's, stroke, diabetes, vertigo.  PMD: Dr. Mikey Bussing     Past Medical History:  Diagnosis Date  . Back pain    low  . Hyperlipemia   . Hypertension     Past Surgical History:  Procedure Laterality Date  . BACK SURGERY    . BREAST EXCISIONAL BIOPSY Right     Family History  Problem Relation Age of Onset  . Cancer Mother     Social History   Tobacco Use  . Smoking status: Former Games developer  . Smokeless tobacco: Never Used  Substance Use Topics  . Alcohol use: Yes    Comment: occasional  . Drug use: Never    No current facility-administered medications for this encounter.  Current Outpatient Medications:  .  meclizine (ANTIVERT) 25 MG tablet, Take 1 tablet (25 mg total) by mouth 3 (three) times daily as needed for dizziness., Disp: 30 tablet, Rfl: 0 .  ondansetron (ZOFRAN ODT) 4 MG disintegrating tablet, Take 1 tablet (4 mg total) by mouth every 8 (eight) hours as needed for nausea or vomiting., Disp: 20 tablet, Rfl: 0 .  aspirin EC 81 MG tablet, Take  81 mg by mouth., Disp: , Rfl:  .  atorvastatin (LIPITOR) 40 MG tablet, Take 40 mg by mouth daily., Disp: , Rfl:  .  gabapentin (NEURONTIN) 100 MG capsule, , Disp: , Rfl:  .  metoprolol succinate (TOPROL-XL) 25 MG 24 hr tablet, Take 1 tablet by mouth daily., Disp: , Rfl:   No Known Allergies   ROS  As noted in HPI.   Physical Exam  BP 135/80 (BP Location: Right Arm)   Pulse 62   Temp 97.8 F (36.6 C) (Oral)   Resp 18   SpO2 96%   Constitutional: Well developed, well nourished, no acute distress Eyes:  EOMI, conjunctiva normal bilaterally HENT: Normocephalic, atraumatic,mucus membranes moist.  TMs normal bilaterally.  Slightly decreased hearing right side compared to left.  Hearing intact bilaterally. Respiratory: Normal inspiratory effort Cardiovascular: Normal rate regular rhythm.  No carotid bruit. GI: nondistended skin: No rash, skin intact Musculoskeletal: no deformities Neurologic: Alert & oriented x 3, patient with difficulty performing tandem gait.  No ataxia with normal walking, move.  Cranial nerves III through XII intact.  Finger-nose, heel shin within normal limits.  Romberg negative.  Dix-Hallpike negative bilaterally. Psychiatric: Speech and behavior appropriate   ED Course   Medications  ondansetron (ZOFRAN-ODT) disintegrating tablet 4 mg (4 mg Oral Given 10/27/20 1250)    No orders of the defined types were placed in this encounter.  No results found for this or any previous visit (from the past 24 hour(s)). No results found.  ED Clinical Impression  1. Peripheral vertigo involving right ear   2. Meniere's disease of right ear      ED Assessment/Plan  Presentation consistent with a peripheral vertigo versus Mnire disease.  Do not think that she has had a cerebellar stroke.  will send home with meclizine, Zofran 4 mg to 3 times a day as needed.  Follow-up with ENT. DR. Suszanne Conners on call.   Discussed  MDM, treatment plan, and plan for follow-up with  patient. Discussed sn/sx that should prompt return to the ED. patient agrees with plan.   Meds ordered this encounter  Medications  . ondansetron (ZOFRAN-ODT) disintegrating tablet 4 mg  . meclizine (ANTIVERT) 25 MG tablet    Sig: Take 1 tablet (25 mg total) by mouth 3 (three) times daily as needed for dizziness.    Dispense:  30 tablet    Refill:  0  . ondansetron (ZOFRAN ODT) 4 MG disintegrating tablet    Sig: Take 1 tablet (4 mg total) by mouth every 8 (eight) hours as needed for nausea or vomiting.    Dispense:  20 tablet    Refill:  0    *This clinic note was created using Scientist, clinical (histocompatibility and immunogenetics). Therefore, there may be occasional mistakes despite careful proofreading.   ?    Domenick Gong, MD 10/27/20 1414

## 2020-11-07 DIAGNOSIS — H903 Sensorineural hearing loss, bilateral: Secondary | ICD-10-CM | POA: Diagnosis not present

## 2020-11-07 DIAGNOSIS — H8111 Benign paroxysmal vertigo, right ear: Secondary | ICD-10-CM | POA: Diagnosis not present

## 2020-11-07 DIAGNOSIS — H9311 Tinnitus, right ear: Secondary | ICD-10-CM | POA: Diagnosis not present

## 2020-11-07 DIAGNOSIS — R42 Dizziness and giddiness: Secondary | ICD-10-CM | POA: Diagnosis not present

## 2020-11-11 ENCOUNTER — Other Ambulatory Visit: Payer: Self-pay

## 2020-11-11 ENCOUNTER — Ambulatory Visit
Admission: RE | Admit: 2020-11-11 | Discharge: 2020-11-11 | Disposition: A | Discharge status: Home / Self Care | Payer: Medicare Other | Source: Ambulatory Visit | Attending: Family Medicine | Admitting: Family Medicine

## 2020-11-11 DIAGNOSIS — Z1231 Encounter for screening mammogram for malignant neoplasm of breast: Secondary | ICD-10-CM

## 2020-12-05 DIAGNOSIS — H903 Sensorineural hearing loss, bilateral: Secondary | ICD-10-CM | POA: Diagnosis not present

## 2020-12-05 DIAGNOSIS — H8111 Benign paroxysmal vertigo, right ear: Secondary | ICD-10-CM | POA: Diagnosis not present

## 2020-12-05 DIAGNOSIS — H9311 Tinnitus, right ear: Secondary | ICD-10-CM | POA: Diagnosis not present

## 2021-02-10 DIAGNOSIS — G4733 Obstructive sleep apnea (adult) (pediatric): Secondary | ICD-10-CM | POA: Diagnosis not present

## 2021-02-10 DIAGNOSIS — H35031 Hypertensive retinopathy, right eye: Secondary | ICD-10-CM | POA: Diagnosis not present

## 2021-02-10 DIAGNOSIS — E7800 Pure hypercholesterolemia, unspecified: Secondary | ICD-10-CM | POA: Diagnosis not present

## 2021-02-10 DIAGNOSIS — M5136 Other intervertebral disc degeneration, lumbar region: Secondary | ICD-10-CM | POA: Diagnosis not present

## 2021-02-10 DIAGNOSIS — I10 Essential (primary) hypertension: Secondary | ICD-10-CM | POA: Diagnosis not present

## 2021-02-10 DIAGNOSIS — M792 Neuralgia and neuritis, unspecified: Secondary | ICD-10-CM | POA: Diagnosis not present

## 2021-02-10 DIAGNOSIS — Z0000 Encounter for general adult medical examination without abnormal findings: Secondary | ICD-10-CM | POA: Diagnosis not present

## 2021-02-10 DIAGNOSIS — Z1389 Encounter for screening for other disorder: Secondary | ICD-10-CM | POA: Diagnosis not present

## 2021-05-19 DIAGNOSIS — Z20822 Contact with and (suspected) exposure to covid-19: Secondary | ICD-10-CM | POA: Diagnosis not present

## 2021-06-20 DIAGNOSIS — H903 Sensorineural hearing loss, bilateral: Secondary | ICD-10-CM | POA: Diagnosis not present

## 2021-07-16 ENCOUNTER — Other Ambulatory Visit: Payer: Self-pay | Admitting: Nurse Practitioner

## 2021-07-16 DIAGNOSIS — N644 Mastodynia: Secondary | ICD-10-CM | POA: Diagnosis not present

## 2021-08-21 ENCOUNTER — Other Ambulatory Visit: Payer: Medicare Other

## 2021-08-21 ENCOUNTER — Inpatient Hospital Stay: Admission: RE | Admit: 2021-08-21 | Payer: Medicare Other | Source: Ambulatory Visit

## 2021-09-03 DIAGNOSIS — H524 Presbyopia: Secondary | ICD-10-CM | POA: Diagnosis not present

## 2021-09-03 DIAGNOSIS — H35033 Hypertensive retinopathy, bilateral: Secondary | ICD-10-CM | POA: Diagnosis not present

## 2021-09-03 DIAGNOSIS — H52203 Unspecified astigmatism, bilateral: Secondary | ICD-10-CM | POA: Diagnosis not present

## 2021-09-03 DIAGNOSIS — H5211 Myopia, right eye: Secondary | ICD-10-CM | POA: Diagnosis not present

## 2021-09-03 DIAGNOSIS — Z961 Presence of intraocular lens: Secondary | ICD-10-CM | POA: Diagnosis not present

## 2021-09-03 DIAGNOSIS — H2513 Age-related nuclear cataract, bilateral: Secondary | ICD-10-CM | POA: Diagnosis not present

## 2021-09-03 DIAGNOSIS — H04123 Dry eye syndrome of bilateral lacrimal glands: Secondary | ICD-10-CM | POA: Diagnosis not present

## 2021-09-09 DIAGNOSIS — Z1211 Encounter for screening for malignant neoplasm of colon: Secondary | ICD-10-CM | POA: Diagnosis not present

## 2021-09-09 DIAGNOSIS — K648 Other hemorrhoids: Secondary | ICD-10-CM | POA: Diagnosis not present

## 2021-09-09 DIAGNOSIS — Z8371 Family history of colonic polyps: Secondary | ICD-10-CM | POA: Diagnosis not present

## 2021-10-31 ENCOUNTER — Other Ambulatory Visit: Payer: Medicare Other

## 2021-12-18 ENCOUNTER — Ambulatory Visit: Payer: Medicare Other

## 2021-12-18 ENCOUNTER — Other Ambulatory Visit: Payer: Self-pay | Admitting: Nurse Practitioner

## 2021-12-18 ENCOUNTER — Ambulatory Visit
Admission: RE | Admit: 2021-12-18 | Discharge: 2021-12-18 | Disposition: A | Discharge status: Home / Self Care | Payer: Medicare Other | Source: Ambulatory Visit | Attending: Nurse Practitioner | Admitting: Nurse Practitioner

## 2021-12-18 DIAGNOSIS — N644 Mastodynia: Secondary | ICD-10-CM | POA: Diagnosis not present

## 2022-04-09 ENCOUNTER — Encounter (HOSPITAL_COMMUNITY): Payer: Self-pay

## 2022-04-09 ENCOUNTER — Ambulatory Visit (HOSPITAL_COMMUNITY)
Admission: EM | Admit: 2022-04-09 | Discharge: 2022-04-09 | Disposition: A | Discharge status: Home / Self Care | Payer: Medicare Other | Attending: Student | Admitting: Student

## 2022-04-09 ENCOUNTER — Ambulatory Visit (INDEPENDENT_AMBULATORY_CARE_PROVIDER_SITE_OTHER): Payer: Medicare Other

## 2022-04-09 ENCOUNTER — Telehealth (HOSPITAL_COMMUNITY): Payer: Self-pay | Admitting: Student

## 2022-04-09 DIAGNOSIS — S92354A Nondisplaced fracture of fifth metatarsal bone, right foot, initial encounter for closed fracture: Secondary | ICD-10-CM

## 2022-04-09 DIAGNOSIS — W19XXXA Unspecified fall, initial encounter: Secondary | ICD-10-CM

## 2022-04-09 DIAGNOSIS — S8001XA Contusion of right knee, initial encounter: Secondary | ICD-10-CM | POA: Diagnosis not present

## 2022-04-09 DIAGNOSIS — S8991XA Unspecified injury of right lower leg, initial encounter: Secondary | ICD-10-CM | POA: Diagnosis not present

## 2022-04-09 DIAGNOSIS — M7989 Other specified soft tissue disorders: Secondary | ICD-10-CM | POA: Diagnosis not present

## 2022-04-09 MED ORDER — TRAMADOL HCL 50 MG PO TABS: 50.0000 mg | ORAL_TABLET | Freq: Four times a day (QID) | ORAL | 0 refills | Status: DC | PRN

## 2022-04-09 NOTE — Discharge Instructions (Addendum)
-  Your foot is broken, and your knee is bruised.  -Boot and crutches while foot is healing. Try to avoid putting weight on the foot.  -You can follow-up with the podiatrist that you saw in the past, information below.  Alternatively, call [facility] for this follow-up.  If you have a primary care provider, they can help you coordinate this. -Tramadol for pain, up to every 6 hours. This medication can cause drowsiness, so don't take before driving. Don't drink alcohol while on this medication. -You can take Tylenol up to 1000 mg 3 times daily -Rest, ice, elevation

## 2022-04-09 NOTE — ED Provider Notes (Addendum)
[facility]    CSN: [id number] Arrival date & time: 04/09/22  0844      History   Chief Complaint Chief Complaint  Patient presents with   Fall    7/19 at 7pm.    Leg Pain    Right knee and foot.     HPI Valerie Chung is a 71 y.o. female presenting with right knee and foot pain for 12 hours following fall that occurred 1 day ago.  History noncontributory.  She states that she thinks that she inverted the right foot, and then fell directly onto the right patella.  Did not fall completely, and denies pain elsewhere.  She states she is having pain over the lateral right foot.  Also with the pain over the patella, very painful with walking.  Relieved by flexion and aggravated with extension.  Denies sensation changes.  Denies pain or injury elsewhere.  She does not take long-term anticoagulation.  HPI  Past Medical History:  Diagnosis Date   Back pain    low   Hyperlipemia    Hypertension     Patient Active Problem List   Diagnosis Date Noted   Chest pain 10/30/2016   Coronary artery disease involving native coronary artery of native heart with angina pectoris (HCC) 10/30/2016   Dyslipidemia (high LDL; low HDL) 10/30/2016    Past Surgical History:  Procedure Laterality Date   BACK SURGERY     BREAST EXCISIONAL BIOPSY Right     OB History   No obstetric history on file.      Home Medications    Prior to Admission medications   Medication Sig Start Date End Date Taking? Authorizing Provider  aspirin EC 81 MG tablet Take 81 mg by mouth.   Yes [provider]  atorvastatin (LIPITOR) 40 MG tablet Take 40 mg by mouth daily. 02/27/20  Yes [provider]  metoprolol succinate (TOPROL-XL) 25 MG 24 hr tablet Take 1 tablet by mouth daily. 06/16/19  Yes [provider]  traMADol (ULTRAM) 50 MG tablet Take 1 tablet (50 mg total) by mouth every 6 (six) hours as needed. 04/09/22  Yes Rhys Martini, PA-C    Family History Family History   Problem Relation Age of Onset   Cancer Mother     Social History Social History   Tobacco Use   Smoking status: Former   Smokeless tobacco: Never  Substance Use Topics   Alcohol use: Yes    Comment: occasional   Drug use: Never     Allergies   Patient has no known allergies.   Review of Systems Review of Systems  Musculoskeletal:        R knee pain  R foot pain   All other systems reviewed and are negative.    Physical Exam Triage Vital Signs ED Triage Vitals  Enc Vitals Group     BP 04/09/22 0931 (!) 148/87     Pulse Rate 04/09/22 0931 (!) 50     Resp 04/09/22 0931 16     Temp 04/09/22 0931 98.1 F (36.7 C)     Temp Source 04/09/22 0931 Oral     SpO2 04/09/22 0931 98 %     Weight 04/09/22 0930 230 lb (104.3 kg)     Height 04/09/22 0930 5\' 9"  (1.753 m)     Head Circumference --      Peak Flow --      Pain Score 04/09/22 0930 10     Pain Loc --  Pain Edu? --      Excl. in GC? --    No data found.  Updated Vital Signs BP (!) 148/87 (BP Location: Right Arm)   Pulse (!) 50   Temp 98.1 F (36.7 C) (Oral)   Resp 16   Ht 5\' 9"  (1.753 m)   Wt 230 lb (104.3 kg)   SpO2 98%   BMI 33.97 kg/m   Visual Acuity Right Eye Distance:   Left Eye Distance:   Bilateral Distance:    Right Eye Near:   Left Eye Near:    Bilateral Near:     Physical Exam Vitals reviewed.  Constitutional:      General: She is not in acute distress.    Appearance: Normal appearance. She is not ill-appearing.  HENT:     Head: Normocephalic and atraumatic.  Pulmonary:     Effort: Pulmonary effort is normal.  Musculoskeletal:     Comments: R knee - 1+ effusion. Tender to palpation over the lateral patella. Knee held in flexion; pain and stiffness with extension. Exam limited due to stiffness and discomfort.   R foot - no skin changes or swelling. TTP 5th metatarsal with small palpable deformity distally. No midfoot or malleolar pain. DP 2+, cap refill < 2 seconds.    Neurological:     General: No focal deficit present.     Mental Status: She is alert and oriented to person, place, and time.  Psychiatric:        Mood and Affect: Mood normal.        Behavior: Behavior normal.        Thought Content: Thought content normal.        Judgment: Judgment normal.      UC Treatments / Results  Labs (all labs ordered are listed, but only abnormal results are displayed) Labs Reviewed - No data to display  EKG   Radiology DG Knee Complete 4 Views Right  Result Date: 04/09/2022 CLINICAL DATA:  Fall onto patella yesterday. Right fifth metatarsal tenderness. Possible inversion injury. Hit right knee and pinky toe. Swelling and right pinky toe. EXAM: RIGHT KNEE - COMPLETE 4+ VIEW COMPARISON:  Right knee radiographs 04/29/2020 FINDINGS: Mild lateral compartment peripheral degenerative osteophytosis. Mild-to-moderate patellofemoral joint space narrowing. Moderate superior and mild inferior patellar degenerative osteophytosis. No joint effusion. No acute fracture or dislocation. There is curvilinear calcification overlying the proximal medial collateral ligament just medial to the superomedial femoral condyle, new from prior. IMPRESSION: 1. 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 2. Calcification overlying the proximal medial collateral ligament likely the sequela of remote ligamentous injury. This is new from 04/29/2020. Electronically Signed   By: 06/29/2020 M.D.   On: 04/09/2022 11:00   DG Foot Complete Right  Result Date: 04/09/2022 CLINICAL DATA:  Fall onto patella yesterday. Right fifth metatarsal tenderness. Possible inversion injury. Hit right knee and pinky toe. Swelling and right pinky toe. EXAM: RIGHT FOOT COMPLETE - 3+ VIEW COMPARISON:  Right ankle radiographs 11/08/2012 FINDINGS: Subtle curvilinear lucencies overlying the fifth metatarsal head with minimal medial sided cortical step-off acute fracture without  significant displacement. Mild joint space narrowing of the interphalangeal joints diffusely. Minimal peripheral degenerative spurring at the first tarsometatarsal joint. Small plantar calcaneal heel spur. Mild chronic spurring at the Achilles insertion on the calcaneus. IMPRESSION: Acute nondisplaced fracture of the fifth metatarsal head. Electronically Signed   By: 11/10/2012 M.D.   On: 04/09/2022 10:55    Procedures Procedures (including critical care  time)  Medications Ordered in UC Medications - No data to display  Initial Impression / Assessment and Plan / UC Course  I have reviewed the triage vital signs and the nursing notes.  Pertinent labs & imaging results that were available during my care of the patient were reviewed by me and considered in my medical decision making (see chart for details).     This patient is a very pleasant 71 y.o. year old female presenting with R 5th metatarsal fracture and R knee contusion.   Xray R knee -  1. 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 2. Calcification overlying the proximal medial collateral ligament likely the sequela of remote ligamentous injury. This is new from 04/29/2020.  Xray R foot - Acute nondisplaced fracture of the fifth metatarsal head.  Placed in CAM boot. She already has crutches. Nonweightbearing. Has followed with podiatry in the past, she can f/u with them. Also provided with [facility] resources. Short course of tramadol for pain - PMP reviewed.   ED return precautions discussed. Patient and spouse (who was present entire visit) verbalizes understanding and agreement.    *Tramadol not in stock at original pharmacy, so CMA called to cancel this, and I resent to [facility] at patient request.   Final Clinical Impressions(s) / UC Diagnoses   Final diagnoses:  Nondisplaced fracture of fifth metatarsal bone, right foot, initial encounter for closed fracture  Contusion of right knee,  initial encounter  Fall, initial encounter     Discharge Instructions      -Your foot is broken, and your knee is bruised.  -Boot and crutches while foot is healing. Try to avoid putting weight on the foot.  -You can follow-up with the podiatrist that you saw in the past, information below.  Alternatively, call [facility] for this follow-up.  If you have a primary care provider, they can help you coordinate this. -Tramadol for pain, up to every 6 hours. This medication can cause drowsiness, so don't take before driving. Don't drink alcohol while on this medication. -You can take Tylenol up to 1000 mg 3 times daily -Rest, ice, elevation     ED Prescriptions     Medication Sig Dispense Auth. Provider   traMADol (ULTRAM) 50 MG tablet Take 1 tablet (50 mg total) by mouth every 6 (six) hours as needed. 15 tablet Rhys Martini, PA-C      I have reviewed the PDMP during this encounter.   Rhys Martini, PA-C 04/09/22 1116    Rhys Martini, PA-C 04/09/22 1627

## 2022-04-09 NOTE — ED Triage Notes (Signed)
Patient fell yesterday In her house. Her right knee hit the floor and she hit the right pinky toe as well.  Swelling in the right pinky toe. Patient states the right knee twisted and popped. Now this knee is painful and can not bear weight fully.

## 2022-04-09 NOTE — Telephone Encounter (Signed)
Patient requesting that tramadol be resent to different pharmacy.  CMA called and canceled the original tramadol, and I resent to different pharmacy.

## 2022-04-17 DIAGNOSIS — M79671 Pain in right foot: Secondary | ICD-10-CM | POA: Diagnosis not present

## 2022-04-17 DIAGNOSIS — M25561 Pain in right knee: Secondary | ICD-10-CM | POA: Diagnosis not present

## 2022-04-17 DIAGNOSIS — S92354A Nondisplaced fracture of fifth metatarsal bone, right foot, initial encounter for closed fracture: Secondary | ICD-10-CM | POA: Diagnosis not present

## 2022-04-27 DIAGNOSIS — M25561 Pain in right knee: Secondary | ICD-10-CM | POA: Diagnosis not present

## 2022-04-27 DIAGNOSIS — M1711 Unilateral primary osteoarthritis, right knee: Secondary | ICD-10-CM | POA: Diagnosis not present

## 2022-04-27 DIAGNOSIS — M2391 Unspecified internal derangement of right knee: Secondary | ICD-10-CM | POA: Diagnosis not present

## 2022-05-29 ENCOUNTER — Other Ambulatory Visit: Payer: Self-pay | Admitting: Family Medicine

## 2022-05-29 DIAGNOSIS — G629 Polyneuropathy, unspecified: Secondary | ICD-10-CM | POA: Diagnosis not present

## 2022-05-29 DIAGNOSIS — S92353A Displaced fracture of fifth metatarsal bone, unspecified foot, initial encounter for closed fracture: Secondary | ICD-10-CM | POA: Diagnosis not present

## 2022-05-29 DIAGNOSIS — Z13820 Encounter for screening for osteoporosis: Secondary | ICD-10-CM

## 2022-05-29 DIAGNOSIS — M5136 Other intervertebral disc degeneration, lumbar region: Secondary | ICD-10-CM | POA: Diagnosis not present

## 2022-05-29 DIAGNOSIS — H35031 Hypertensive retinopathy, right eye: Secondary | ICD-10-CM | POA: Diagnosis not present

## 2022-05-29 DIAGNOSIS — G4733 Obstructive sleep apnea (adult) (pediatric): Secondary | ICD-10-CM | POA: Diagnosis not present

## 2022-05-29 DIAGNOSIS — Z0000 Encounter for general adult medical examination without abnormal findings: Secondary | ICD-10-CM | POA: Diagnosis not present

## 2022-05-29 DIAGNOSIS — I10 Essential (primary) hypertension: Secondary | ICD-10-CM | POA: Diagnosis not present

## 2022-05-29 DIAGNOSIS — E7800 Pure hypercholesterolemia, unspecified: Secondary | ICD-10-CM | POA: Diagnosis not present

## 2022-06-03 DIAGNOSIS — S92354A Nondisplaced fracture of fifth metatarsal bone, right foot, initial encounter for closed fracture: Secondary | ICD-10-CM | POA: Diagnosis not present

## 2022-07-03 DIAGNOSIS — S92351D Displaced fracture of fifth metatarsal bone, right foot, subsequent encounter for fracture with routine healing: Secondary | ICD-10-CM | POA: Diagnosis not present

## 2022-08-19 DIAGNOSIS — M1711 Unilateral primary osteoarthritis, right knee: Secondary | ICD-10-CM | POA: Diagnosis not present

## 2022-08-19 DIAGNOSIS — M2391 Unspecified internal derangement of right knee: Secondary | ICD-10-CM | POA: Diagnosis not present

## 2022-09-06 DIAGNOSIS — M25561 Pain in right knee: Secondary | ICD-10-CM | POA: Diagnosis not present

## 2022-09-09 DIAGNOSIS — H52203 Unspecified astigmatism, bilateral: Secondary | ICD-10-CM | POA: Diagnosis not present

## 2022-09-09 DIAGNOSIS — Z961 Presence of intraocular lens: Secondary | ICD-10-CM | POA: Diagnosis not present

## 2022-09-09 DIAGNOSIS — H04123 Dry eye syndrome of bilateral lacrimal glands: Secondary | ICD-10-CM | POA: Diagnosis not present

## 2022-09-09 DIAGNOSIS — H5211 Myopia, right eye: Secondary | ICD-10-CM | POA: Diagnosis not present

## 2022-09-09 DIAGNOSIS — H524 Presbyopia: Secondary | ICD-10-CM | POA: Diagnosis not present

## 2022-09-09 DIAGNOSIS — H2511 Age-related nuclear cataract, right eye: Secondary | ICD-10-CM | POA: Diagnosis not present

## 2022-09-11 DIAGNOSIS — M179 Osteoarthritis of knee, unspecified: Secondary | ICD-10-CM | POA: Diagnosis not present

## 2022-09-11 DIAGNOSIS — M25562 Pain in left knee: Secondary | ICD-10-CM | POA: Diagnosis not present

## 2022-09-11 DIAGNOSIS — M1711 Unilateral primary osteoarthritis, right knee: Secondary | ICD-10-CM | POA: Diagnosis not present

## 2022-09-11 DIAGNOSIS — M2391 Unspecified internal derangement of right knee: Secondary | ICD-10-CM | POA: Diagnosis not present

## 2022-10-14 DIAGNOSIS — M1711 Unilateral primary osteoarthritis, right knee: Secondary | ICD-10-CM | POA: Diagnosis not present

## 2022-10-14 DIAGNOSIS — H35031 Hypertensive retinopathy, right eye: Secondary | ICD-10-CM | POA: Diagnosis not present

## 2022-10-14 DIAGNOSIS — E7800 Pure hypercholesterolemia, unspecified: Secondary | ICD-10-CM | POA: Diagnosis not present

## 2022-10-14 DIAGNOSIS — M5416 Radiculopathy, lumbar region: Secondary | ICD-10-CM | POA: Diagnosis not present

## 2022-10-26 DIAGNOSIS — M5416 Radiculopathy, lumbar region: Secondary | ICD-10-CM | POA: Diagnosis not present

## 2022-10-28 DIAGNOSIS — M5416 Radiculopathy, lumbar region: Secondary | ICD-10-CM | POA: Diagnosis not present

## 2022-11-02 DIAGNOSIS — M5416 Radiculopathy, lumbar region: Secondary | ICD-10-CM | POA: Diagnosis not present

## 2022-11-04 DIAGNOSIS — M5416 Radiculopathy, lumbar region: Secondary | ICD-10-CM | POA: Diagnosis not present

## 2022-11-09 DIAGNOSIS — M5416 Radiculopathy, lumbar region: Secondary | ICD-10-CM | POA: Diagnosis not present

## 2022-11-11 DIAGNOSIS — M5416 Radiculopathy, lumbar region: Secondary | ICD-10-CM | POA: Diagnosis not present

## 2022-11-27 ENCOUNTER — Ambulatory Visit
Admission: RE | Admit: 2022-11-27 | Discharge: 2022-11-27 | Disposition: A | Discharge status: Home / Self Care | Payer: Medicare Other | Source: Ambulatory Visit | Attending: Family Medicine | Admitting: Family Medicine

## 2022-11-27 DIAGNOSIS — Z13820 Encounter for screening for osteoporosis: Secondary | ICD-10-CM

## 2022-11-27 DIAGNOSIS — Z780 Asymptomatic menopausal state: Secondary | ICD-10-CM | POA: Diagnosis not present

## 2022-11-30 DIAGNOSIS — E7800 Pure hypercholesterolemia, unspecified: Secondary | ICD-10-CM | POA: Diagnosis not present

## 2022-12-03 DIAGNOSIS — M1711 Unilateral primary osteoarthritis, right knee: Secondary | ICD-10-CM | POA: Diagnosis not present

## 2022-12-30 NOTE — Progress Notes (Addendum)
Anesthesia Review:  PCP: DR Aura Dials clearance dated 11/10/22 on chart LOV 05/29/22- Valerie Chung LOV EDrPalwani 09/28/22 on chart  Cardiologist : saw cardiologist few years back per pt not followed by one now -  DR Rohrbeck  Chest x-ray : EKG : Echo : Stress test: 2019 - Care Everywhere  :  Activity level: can do a flight of stairs wihtout difficutly  Sleep Study/ CPAP : sleep apnea no cpap  Fasting Blood Sugar :      / Checks Blood Sugar -- times a day:   Blood Thinner/ Instructions /Last Dose: ASA / Instructions/ Last Dose :    Med hx and instrucitons completed via phone call on 01/04/23.  PT to comie in for labs on 01/07/23 at 1000am.  Bag in lab.    Blood pressure at lab appt was 154/107.  PT denies any chest pain, shortnessof breath, dizziness, chestpain or blurred vision.  PT has not taken am meds.  PT givne juice and water and crackers.  PT took am bp  meds.   EKg done- [name], [facility] aware.  No new orders given.

## 2022-12-30 NOTE — Patient Instructions (Signed)
SURGICAL WAITING ROOM VISITATION  Patients having surgery or a procedure may have no more than 2 support people in the waiting area - these visitors may rotate.    Children under the age of 75 must have an adult with them who is not the patient.  Due to an increase in RSV and influenza rates and associated hospitalizations, children ages 104 and under may not visit patients in [facility] hospitals.  If the patient needs to stay at the hospital during part of their recovery, the visitor guidelines for inpatient rooms apply. Pre-op nurse will coordinate an appropriate time for 1 support person to accompany patient in pre-op.  This support person may not rotate.    Please refer to the [facility] website for the visitor guidelines for Inpatients (after your surgery is over and you are in a regular room).       Your procedure is scheduled on:  01/12/23    Report to [facility] Main Entrance    Report to admitting at  0515 AM   Call this number if you have problems the morning of surgery [phone]   Do not eat food :After Midnight.   After Midnight you may have the following liquids until __ 0415____ Am  DAY OF SURGERY  Water Non-Citrus Juices (without pulp, NO RED-Apple, White grape, White cranberry) Black Coffee (NO MILK/CREAM OR CREAMERS, sugar ok)  Clear Tea (NO MILK/CREAM OR CREAMERS, sugar ok) regular and decaf                             Plain Jell-O (NO RED)                                           Fruit ices (not with fruit pulp, NO RED)                                     Popsicles (NO RED)                                                               Sports drinks like Gatorade (NO RED)                    The day of surgery:  Drink ONE (1) Pre-Surgery Clear Ensure or G2 at   0415AM ( have completed by )  the morning of surgery. Drink in one sitting. Do not sip.  This drink was given to you during your hospital  pre-op appointment visit. Nothing else to  drink after completing the  Pre-Surgery Clear Ensure or G2.          If you have questions, please contact your surgeon's office.        Oral Hygiene is also important to reduce your risk of infection.                                    Remember - BRUSH YOUR TEETH THE MORNING OF  SURGERY WITH YOUR REGULAR TOOTHPASTE  DENTURES WILL BE REMOVED PRIOR TO SURGERY PLEASE DO NOT APPLY "Poly grip" OR ADHESIVES!!!   Do NOT smoke after Midnight   Take these medicines the morning of surgery with A SIP OF WATER:   DO NOT TAKE ANY ORAL DIABETIC MEDICATIONS DAY OF YOUR SURGERY  Bring CPAP mask and tubing day of surgery.                              You may not have any metal on your body including hair pins, jewelry, and body piercing             Do not wear make-up, lotions, powders, perfumes/cologne, or deodorant  Do not wear nail polish including gel and S&S, artificial/acrylic nails, or any other type of covering on natural nails including finger and toenails. If you have artificial nails, gel coating, etc. that needs to be removed by a nail salon please have this removed prior to surgery or surgery may need to be canceled/ delayed if the surgeon/ anesthesia feels like they are unable to be safely monitored.   Do not shave  48 hours prior to surgery.               Men may shave face and neck.   Do not bring valuables to the hospital. [facility] IS NOT             RESPONSIBLE   FOR VALUABLES.   Contacts, glasses, dentures or bridgework may not be worn into surgery.   Bring small overnight bag day of surgery.   DO NOT BRING YOUR HOME MEDICATIONS TO THE HOSPITAL. PHARMACY WILL DISPENSE MEDICATIONS LISTED ON YOUR MEDICATION LIST TO YOU DURING YOUR ADMISSION IN THE HOSPITAL!    Patients discharged on the day of surgery will not be allowed to drive home.  Someone NEEDS to stay with you for the first 24 hours after anesthesia.   Special Instructions: Bring a copy of your healthcare power  of attorney and living will documents the day of surgery if you haven't scanned them before.              Please read over the following fact sheets you were given: IF YOU HAVE QUESTIONS ABOUT YOUR PRE-OP INSTRUCTIONS PLEASE CALL [phone]   If you received a COVID test during your pre-op visit  it is requested that you wear a mask when out in public, stay away from anyone that may not be feeling well and notify your surgeon if you develop symptoms. If you test positive for Covid or have been in contact with anyone that has tested positive in the last 10 days please notify you surgeon.

## 2023-01-04 ENCOUNTER — Other Ambulatory Visit: Payer: Self-pay

## 2023-01-04 ENCOUNTER — Encounter (HOSPITAL_COMMUNITY)
Admission: RE | Admit: 2023-01-04 | Discharge: 2023-01-04 | Disposition: A | Discharge status: Home / Self Care | Payer: Medicare Other | Source: Ambulatory Visit | Attending: Family Medicine | Admitting: Family Medicine

## 2023-01-04 ENCOUNTER — Encounter (HOSPITAL_COMMUNITY): Payer: Self-pay

## 2023-01-04 DIAGNOSIS — Z01818 Encounter for other preprocedural examination: Secondary | ICD-10-CM | POA: Diagnosis not present

## 2023-01-04 HISTORY — DX: Sleep apnea, unspecified: G47.30

## 2023-01-04 HISTORY — DX: Unspecified osteoarthritis, unspecified site: M19.90

## 2023-01-04 HISTORY — DX: Other complications of anesthesia, initial encounter: T88.59XA

## 2023-01-07 ENCOUNTER — Encounter (HOSPITAL_COMMUNITY)
Admission: RE | Admit: 2023-01-07 | Discharge: 2023-01-07 | Disposition: A | Discharge status: Home / Self Care | Payer: Medicare Other | Source: Ambulatory Visit | Attending: Orthopedic Surgery | Admitting: Orthopedic Surgery

## 2023-01-07 DIAGNOSIS — Z01818 Encounter for other preprocedural examination: Secondary | ICD-10-CM

## 2023-01-07 LAB — BASIC METABOLIC PANEL
Anion gap: 8 (ref 5–15)
BUN: 10 mg/dL (ref 8–23)
CO2: 26 mmol/L (ref 22–32)
Calcium: 8.3 mg/dL — ABNORMAL LOW (ref 8.9–10.3)
Chloride: 106 mmol/L (ref 98–111)
Creatinine, Ser: 0.84 mg/dL (ref 0.44–1.00)
GFR, Estimated: >60 mL/min (ref >60)
Glucose, Bld: 101 mg/dL — ABNORMAL HIGH (ref 70–99)
Potassium: 3.4 mmol/L — ABNORMAL LOW (ref 3.5–5.1)
Sodium: 140 mmol/L (ref 135–145)

## 2023-01-07 LAB — CBC
HCT: 41.2 % (ref 36.0–46.0)
Hemoglobin: 13 g/dL (ref 12.0–15.0)
MCH: 29.7 pg (ref 26.0–34.0)
MCHC: 31.6 g/dL (ref 30.0–36.0)
MCV: 94.1 fL (ref 80.0–100.0)
Platelets: 294 10*3/uL (ref 150–400)
RBC: 4.38 MIL/uL (ref 3.87–5.11)
RDW: 13.6 % (ref 11.5–15.5)
WBC: 5.3 10*3/uL (ref 4.0–10.5)
nRBC: 0 % (ref 0.0–0.2)

## 2023-01-07 LAB — SURGICAL PCR SCREEN
MRSA, PCR: NEGATIVE
Staphylococcus aureus: NEGATIVE

## 2023-01-12 ENCOUNTER — Ambulatory Visit (HOSPITAL_BASED_OUTPATIENT_CLINIC_OR_DEPARTMENT_OTHER): Payer: Medicare Other | Admitting: Certified Registered Nurse Anesthetist

## 2023-01-12 ENCOUNTER — Observation Stay (HOSPITAL_COMMUNITY)
Admission: RE | Admit: 2023-01-12 | Discharge: 2023-01-13 | Disposition: A | Discharge status: Home / Self Care | Payer: Medicare Other | Source: Ambulatory Visit | Attending: Orthopedic Surgery | Admitting: Orthopedic Surgery

## 2023-01-12 ENCOUNTER — Ambulatory Visit (HOSPITAL_COMMUNITY): Payer: Medicare Other | Admitting: Physician Assistant

## 2023-01-12 ENCOUNTER — Encounter (HOSPITAL_COMMUNITY): Payer: Self-pay | Admitting: Orthopedic Surgery

## 2023-01-12 ENCOUNTER — Other Ambulatory Visit: Payer: Self-pay

## 2023-01-12 ENCOUNTER — Encounter (HOSPITAL_COMMUNITY)
Admission: RE | Disposition: A | Discharge status: Home / Self Care | Payer: Self-pay | Source: Ambulatory Visit | Attending: Orthopedic Surgery

## 2023-01-12 DIAGNOSIS — G4730 Sleep apnea, unspecified: Secondary | ICD-10-CM

## 2023-01-12 DIAGNOSIS — I2510 Atherosclerotic heart disease of native coronary artery without angina pectoris: Secondary | ICD-10-CM | POA: Insufficient documentation

## 2023-01-12 DIAGNOSIS — I10 Essential (primary) hypertension: Secondary | ICD-10-CM | POA: Diagnosis not present

## 2023-01-12 DIAGNOSIS — G8918 Other acute postprocedural pain: Secondary | ICD-10-CM | POA: Diagnosis not present

## 2023-01-12 DIAGNOSIS — Z96651 Presence of right artificial knee joint: Secondary | ICD-10-CM

## 2023-01-12 DIAGNOSIS — M25761 Osteophyte, right knee: Secondary | ICD-10-CM | POA: Diagnosis not present

## 2023-01-12 DIAGNOSIS — M1711 Unilateral primary osteoarthritis, right knee: Secondary | ICD-10-CM | POA: Diagnosis not present

## 2023-01-12 DIAGNOSIS — M25461 Effusion, right knee: Secondary | ICD-10-CM | POA: Diagnosis not present

## 2023-01-12 DIAGNOSIS — Z87891 Personal history of nicotine dependence: Secondary | ICD-10-CM | POA: Insufficient documentation

## 2023-01-12 DIAGNOSIS — Z01818 Encounter for other preprocedural examination: Secondary | ICD-10-CM

## 2023-01-12 DIAGNOSIS — M659 Synovitis and tenosynovitis, unspecified: Secondary | ICD-10-CM | POA: Diagnosis not present

## 2023-01-12 HISTORY — PX: TOTAL KNEE ARTHROPLASTY: SHX125

## 2023-01-12 SURGERY — ARTHROPLASTY, KNEE, TOTAL
Anesthesia: Spinal | Site: Knee | Laterality: Right

## 2023-01-12 MED ORDER — KETOROLAC TROMETHAMINE 30 MG/ML IJ SOLN
INTRAMUSCULAR | Status: AC
  Filled 2023-01-12: qty 1

## 2023-01-12 MED ORDER — FENTANYL CITRATE PF 50 MCG/ML IJ SOSY: 25.0000 ug | PREFILLED_SYRINGE | INTRAMUSCULAR | Status: DC | PRN

## 2023-01-12 MED ORDER — ACETAMINOPHEN 500 MG PO TABS
1000.0000 mg | ORAL_TABLET | Freq: Four times a day (QID) | ORAL | Status: AC
  Administered 2023-01-12 – 2023-01-13 (×4): 1000 mg via ORAL
  Filled 2023-01-12 (×4): qty 2

## 2023-01-12 MED ORDER — STERILE WATER FOR IRRIGATION IR SOLN
Status: DC | PRN
  Administered 2023-01-12: 2000 mL

## 2023-01-12 MED ORDER — METHOCARBAMOL 500 MG IVPB - SIMPLE MED: 500.0000 mg | Freq: Four times a day (QID) | INTRAVENOUS | Status: DC | PRN

## 2023-01-12 MED ORDER — ASPIRIN 81 MG PO CHEW
81.0000 mg | CHEWABLE_TABLET | Freq: Two times a day (BID) | ORAL | Status: DC
  Administered 2023-01-12 – 2023-01-13 (×2): 81 mg via ORAL
  Filled 2023-01-12 (×2): qty 1

## 2023-01-12 MED ORDER — ONDANSETRON HCL 4 MG/2ML IJ SOLN
INTRAMUSCULAR | Status: DC | PRN
  Administered 2023-01-12: 4 mg via INTRAVENOUS

## 2023-01-12 MED ORDER — PROPOFOL 500 MG/50ML IV EMUL
INTRAVENOUS | Status: DC | PRN
  Administered 2023-01-12: 65 ug/kg/min via INTRAVENOUS

## 2023-01-12 MED ORDER — FENTANYL CITRATE (PF) 100 MCG/2ML IJ SOLN
INTRAMUSCULAR | Status: DC | PRN
  Administered 2023-01-12: 50 ug via INTRAVENOUS

## 2023-01-12 MED ORDER — LACTATED RINGERS IV SOLN
INTRAVENOUS | Status: DC
Start: 2023-01-12 — End: 2023-01-12

## 2023-01-12 MED ORDER — ROPIVACAINE HCL 5 MG/ML IJ SOLN: INTRAMUSCULAR | Status: DC | PRN

## 2023-01-12 MED ORDER — POLYETHYLENE GLYCOL 3350 17 G PO PACK
17.0000 g | PACK | Freq: Two times a day (BID) | ORAL | Status: DC
  Administered 2023-01-12 – 2023-01-13 (×3): 17 g via ORAL
  Filled 2023-01-12 (×3): qty 1

## 2023-01-12 MED ORDER — PHENOL 1.4 % MT LIQD: 1.0000 | OROMUCOSAL | Status: DC | PRN

## 2023-01-12 MED ORDER — OXYCODONE HCL 5 MG PO TABS
10.0000 mg | ORAL_TABLET | ORAL | Status: DC | PRN
  Administered 2023-01-12 – 2023-01-13 (×3): 15 mg via ORAL
  Filled 2023-01-12 (×4): qty 3

## 2023-01-12 MED ORDER — LIDOCAINE HCL (PF) 2 % IJ SOLN
INTRAMUSCULAR | Status: AC
  Filled 2023-01-12: qty 5

## 2023-01-12 MED ORDER — CEFAZOLIN SODIUM-DEXTROSE 2-4 GM/100ML-% IV SOLN
2.0000 g | Freq: Four times a day (QID) | INTRAVENOUS | Status: AC
  Administered 2023-01-12 – 2023-01-13 (×2): 2 g via INTRAVENOUS
  Filled 2023-01-12 (×3): qty 100

## 2023-01-12 MED ORDER — DEXAMETHASONE SODIUM PHOSPHATE 10 MG/ML IJ SOLN
10.0000 mg | Freq: Once | INTRAMUSCULAR | Status: AC
  Administered 2023-01-13: 10 mg via INTRAVENOUS
  Filled 2023-01-12: qty 1

## 2023-01-12 MED ORDER — PHENYLEPHRINE 80 MCG/ML (10ML) SYRINGE FOR IV PUSH (FOR BLOOD PRESSURE SUPPORT)
PREFILLED_SYRINGE | INTRAVENOUS | Status: DC | PRN
  Administered 2023-01-12 (×4): 80 ug via INTRAVENOUS

## 2023-01-12 MED ORDER — SODIUM CHLORIDE 0.9 % IR SOLN
Status: DC | PRN
  Administered 2023-01-12: 1000 mL

## 2023-01-12 MED ORDER — DEXAMETHASONE SODIUM PHOSPHATE 10 MG/ML IJ SOLN
INTRAMUSCULAR | Status: AC
  Filled 2023-01-12: qty 1

## 2023-01-12 MED ORDER — ACETAMINOPHEN 325 MG PO TABS: 325.0000 mg | ORAL_TABLET | Freq: Four times a day (QID) | ORAL | Status: DC | PRN

## 2023-01-12 MED ORDER — ONDANSETRON HCL 4 MG/2ML IJ SOLN: 4.0000 mg | Freq: Four times a day (QID) | INTRAMUSCULAR | Status: DC | PRN

## 2023-01-12 MED ORDER — TRANEXAMIC ACID-NACL 1000-0.7 MG/100ML-% IV SOLN
1000.0000 mg | Freq: Once | INTRAVENOUS | Status: AC
  Administered 2023-01-12: 1000 mg via INTRAVENOUS
  Filled 2023-01-12: qty 100

## 2023-01-12 MED ORDER — SODIUM CHLORIDE 0.9 % IV SOLN: INTRAVENOUS | Status: DC

## 2023-01-12 MED ORDER — DOCUSATE SODIUM 100 MG PO CAPS
100.0000 mg | ORAL_CAPSULE | Freq: Two times a day (BID) | ORAL | Status: DC
  Administered 2023-01-12 – 2023-01-13 (×2): 100 mg via ORAL
  Filled 2023-01-12 (×2): qty 1

## 2023-01-12 MED ORDER — SODIUM CHLORIDE (PF) 0.9 % IJ SOLN
INTRAMUSCULAR | Status: DC | PRN
  Administered 2023-01-12: 30 mL

## 2023-01-12 MED ORDER — METOCLOPRAMIDE HCL 5 MG PO TABS: 5.0000 mg | ORAL_TABLET | Freq: Three times a day (TID) | ORAL | Status: DC | PRN

## 2023-01-12 MED ORDER — TRANEXAMIC ACID-NACL 1000-0.7 MG/100ML-% IV SOLN
1000.0000 mg | INTRAVENOUS | Status: AC
  Administered 2023-01-12: 1000 mg via INTRAVENOUS
  Filled 2023-01-12: qty 100

## 2023-01-12 MED ORDER — HYDROMORPHONE HCL 1 MG/ML IJ SOLN: 0.5000 mg | INTRAMUSCULAR | Status: DC | PRN

## 2023-01-12 MED ORDER — ORAL CARE MOUTH RINSE
15.0000 mL | Freq: Once | OROMUCOSAL | Status: AC
Start: 2023-01-12 — End: 2023-01-12

## 2023-01-12 MED ORDER — KETOROLAC TROMETHAMINE 30 MG/ML IJ SOLN
INTRAMUSCULAR | Status: DC | PRN
  Administered 2023-01-12: 30 mg

## 2023-01-12 MED ORDER — EPINEPHRINE PF 1 MG/ML IJ SOLN
INTRAMUSCULAR | Status: AC
  Filled 2023-01-12: qty 2

## 2023-01-12 MED ORDER — PROPOFOL 10 MG/ML IV BOLUS
INTRAVENOUS | Status: DC | PRN
  Administered 2023-01-12: 20 mg via INTRAVENOUS
  Administered 2023-01-12: 10 mg via INTRAVENOUS
  Administered 2023-01-12: 20 mg via INTRAVENOUS

## 2023-01-12 MED ORDER — MENTHOL 3 MG MT LOZG: 1.0000 | LOZENGE | OROMUCOSAL | Status: DC | PRN

## 2023-01-12 MED ORDER — SODIUM CHLORIDE (PF) 0.9 % IJ SOLN
INTRAMUSCULAR | Status: AC
  Filled 2023-01-12: qty 30

## 2023-01-12 MED ORDER — AMISULPRIDE (ANTIEMETIC) 5 MG/2ML IV SOLN: 10.0000 mg | Freq: Once | INTRAVENOUS | Status: DC | PRN

## 2023-01-12 MED ORDER — METOCLOPRAMIDE HCL 5 MG/ML IJ SOLN: 5.0000 mg | Freq: Three times a day (TID) | INTRAMUSCULAR | Status: DC | PRN

## 2023-01-12 MED ORDER — ONDANSETRON HCL 4 MG/2ML IJ SOLN
INTRAMUSCULAR | Status: AC
  Filled 2023-01-12: qty 2

## 2023-01-12 MED ORDER — BUPIVACAINE-EPINEPHRINE (PF) 0.25% -1:200000 IJ SOLN
INTRAMUSCULAR | Status: DC | PRN
  Administered 2023-01-12: 30 mL

## 2023-01-12 MED ORDER — ROPIVACAINE HCL 5 MG/ML IJ SOLN
INTRAMUSCULAR | Status: DC | PRN
  Administered 2023-01-12: 20 mL via PERINEURAL

## 2023-01-12 MED ORDER — BISACODYL 10 MG RE SUPP: 10.0000 mg | Freq: Every day | RECTAL | Status: DC | PRN

## 2023-01-12 MED ORDER — CHLORHEXIDINE GLUCONATE 0.12 % MT SOLN
15.0000 mL | Freq: Once | OROMUCOSAL | Status: AC
  Administered 2023-01-12: 15 mL via OROMUCOSAL

## 2023-01-12 MED ORDER — DEXAMETHASONE SODIUM PHOSPHATE 10 MG/ML IJ SOLN
8.0000 mg | Freq: Once | INTRAMUSCULAR | Status: AC
  Administered 2023-01-12: 4 mg via INTRAVENOUS

## 2023-01-12 MED ORDER — POVIDONE-IODINE 10 % EX SWAB: 2.0000 | Freq: Once | CUTANEOUS | Status: DC

## 2023-01-12 MED ORDER — EPHEDRINE SULFATE-NACL 50-0.9 MG/10ML-% IV SOSY
PREFILLED_SYRINGE | INTRAVENOUS | Status: DC | PRN
  Administered 2023-01-12: 5 mg via INTRAVENOUS
  Administered 2023-01-12 (×2): 10 mg via INTRAVENOUS

## 2023-01-12 MED ORDER — DIPHENHYDRAMINE HCL 12.5 MG/5ML PO ELIX: 12.5000 mg | ORAL_SOLUTION | ORAL | Status: DC | PRN

## 2023-01-12 MED ORDER — ACETAMINOPHEN 500 MG PO TABS
1000.0000 mg | ORAL_TABLET | Freq: Once | ORAL | Status: AC
  Administered 2023-01-12: 1000 mg via ORAL
  Filled 2023-01-12: qty 2

## 2023-01-12 MED ORDER — CLONIDINE HCL (ANALGESIA) 100 MCG/ML EP SOLN
EPIDURAL | Status: DC | PRN
  Administered 2023-01-12: 50 ug

## 2023-01-12 MED ORDER — FENTANYL CITRATE (PF) 100 MCG/2ML IJ SOLN
INTRAMUSCULAR | Status: AC
  Filled 2023-01-12: qty 2

## 2023-01-12 MED ORDER — ONDANSETRON HCL 4 MG PO TABS
4.0000 mg | ORAL_TABLET | Freq: Four times a day (QID) | ORAL | Status: DC | PRN
  Administered 2023-01-12: 4 mg via ORAL
  Filled 2023-01-12: qty 1

## 2023-01-12 MED ORDER — BUPIVACAINE HCL (PF) 0.25 % IJ SOLN
INTRAMUSCULAR | Status: AC
  Filled 2023-01-12: qty 30

## 2023-01-12 MED ORDER — BUPIVACAINE IN DEXTROSE 0.75-8.25 % IT SOLN
INTRATHECAL | Status: DC | PRN
  Administered 2023-01-12: 1.6 mL via INTRATHECAL

## 2023-01-12 MED ORDER — 0.9 % SODIUM CHLORIDE (POUR BTL) OPTIME
TOPICAL | Status: DC | PRN
  Administered 2023-01-12: 1000 mL

## 2023-01-12 MED ORDER — ATORVASTATIN CALCIUM 40 MG PO TABS
40.0000 mg | ORAL_TABLET | Freq: Every day | ORAL | Status: DC
  Administered 2023-01-12 – 2023-01-13 (×2): 40 mg via ORAL
  Filled 2023-01-12 (×2): qty 1

## 2023-01-12 MED ORDER — METOPROLOL SUCCINATE ER 25 MG PO TB24
25.0000 mg | ORAL_TABLET | Freq: Every day | ORAL | Status: DC
  Administered 2023-01-12 – 2023-01-13 (×2): 25 mg via ORAL
  Filled 2023-01-12 (×2): qty 1

## 2023-01-12 MED ORDER — OXYCODONE HCL 5 MG PO TABS
5.0000 mg | ORAL_TABLET | ORAL | Status: DC | PRN
  Administered 2023-01-12: 5 mg via ORAL
  Filled 2023-01-12: qty 1

## 2023-01-12 MED ORDER — METHOCARBAMOL 500 MG PO TABS
500.0000 mg | ORAL_TABLET | Freq: Four times a day (QID) | ORAL | Status: DC | PRN
  Administered 2023-01-12 (×2): 500 mg via ORAL
  Filled 2023-01-12 (×2): qty 1

## 2023-01-12 MED ORDER — CELECOXIB 200 MG PO CAPS
200.0000 mg | ORAL_CAPSULE | Freq: Two times a day (BID) | ORAL | Status: DC
  Administered 2023-01-12 – 2023-01-13 (×3): 200 mg via ORAL
  Filled 2023-01-12 (×3): qty 1

## 2023-01-12 MED ORDER — PROPOFOL 1000 MG/100ML IV EMUL
INTRAVENOUS | Status: AC
  Filled 2023-01-12: qty 100

## 2023-01-12 MED ORDER — CEFAZOLIN SODIUM-DEXTROSE 2-4 GM/100ML-% IV SOLN
2.0000 g | INTRAVENOUS | Status: AC
  Administered 2023-01-12: 2 g via INTRAVENOUS
  Filled 2023-01-12: qty 100

## 2023-01-12 SURGICAL SUPPLY — 58 items
ADH SKN CLS APL DERMABOND .7 (GAUZE/BANDAGES/DRESSINGS) ×1
ATTUNE MED ANAT PAT 35 KNEE (Knees) IMPLANT
BAG COUNTER SPONGE SURGICOUNT (BAG) IMPLANT
BAG SPEC THK2 15X12 ZIP CLS (MISCELLANEOUS)
BAG SPNG CNTER NS LX DISP (BAG)
BAG ZIPLOCK 12X15 (MISCELLANEOUS) IMPLANT
BASEPLATE TIB CMT FB PCKT SZ5 (Knees) IMPLANT
BLADE SAW SGTL 11.0X1.19X90.0M (BLADE) IMPLANT
BLADE SAW SGTL 13.0X1.19X90.0M (BLADE) ×1 IMPLANT
BNDG CMPR 5X62 HK CLSR LF (GAUZE/BANDAGES/DRESSINGS) ×1
BNDG CMPR MED 10X6 ELC LF (GAUZE/BANDAGES/DRESSINGS) ×1
BNDG ELASTIC 6INX 5YD STR LF (GAUZE/BANDAGES/DRESSINGS) ×1 IMPLANT
BNDG ELASTIC 6X10 VLCR STRL LF (GAUZE/BANDAGES/DRESSINGS) IMPLANT
BOWL SMART MIX CTS (DISPOSABLE) ×1 IMPLANT
BSPLAT TIB 5 CMNT FXBRNG STRL (Knees) ×1 IMPLANT
CEMENT HV SMART SET (Cement) IMPLANT
COMP FEM CMT ATTUNE NRW 5 RT (Joint) ×1 IMPLANT
COMPONENT FEM CMT ATTN NRW 5RT (Joint) IMPLANT
COVER SURGICAL LIGHT HANDLE (MISCELLANEOUS) ×1 IMPLANT
CUFF TOURN SGL QUICK 34 (TOURNIQUET CUFF) ×1
CUFF TRNQT CYL 34X4.125X (TOURNIQUET CUFF) ×1 IMPLANT
DERMABOND ADVANCED .7 DNX12 (GAUZE/BANDAGES/DRESSINGS) ×1 IMPLANT
DRAPE U-SHAPE 47X51 STRL (DRAPES) ×1 IMPLANT
DRESSING AQUACEL AG SP 3.5X10 (GAUZE/BANDAGES/DRESSINGS) ×1 IMPLANT
DRSG AQUACEL AG ADV 3.5X10 (GAUZE/BANDAGES/DRESSINGS) IMPLANT
DRSG AQUACEL AG SP 3.5X10 (GAUZE/BANDAGES/DRESSINGS) ×1
DURAPREP 26ML APPLICATOR (WOUND CARE) ×2 IMPLANT
ELECT REM PT RETURN 15FT ADLT (MISCELLANEOUS) ×1 IMPLANT
GLOVE BIO SURGEON STRL SZ 6 (GLOVE) ×1 IMPLANT
GLOVE BIOGEL PI IND STRL 6.5 (GLOVE) ×1 IMPLANT
GLOVE BIOGEL PI IND STRL 7.5 (GLOVE) ×1 IMPLANT
GLOVE ORTHO TXT STRL SZ7.5 (GLOVE) ×2 IMPLANT
GOWN STRL REUS W/ TWL LRG LVL3 (GOWN DISPOSABLE) ×2 IMPLANT
GOWN STRL REUS W/TWL LRG LVL3 (GOWN DISPOSABLE) ×2
HANDPIECE INTERPULSE COAX TIP (DISPOSABLE) ×1
HOLDER FOLEY CATH W/STRAP (MISCELLANEOUS) IMPLANT
INSERT TIB KNEE ATTUNE 5 6 RT (Insert) IMPLANT
KIT TURNOVER KIT A (KITS) IMPLANT
MANIFOLD NEPTUNE II (INSTRUMENTS) ×1 IMPLANT
NDL SAFETY ECLIP 18X1.5 (MISCELLANEOUS) IMPLANT
NS IRRIG 1000ML POUR BTL (IV SOLUTION) ×1 IMPLANT
PACK TOTAL KNEE CUSTOM (KITS) ×1 IMPLANT
PIN FIX SIGMA LCS THRD HI (PIN) IMPLANT
PROTECTOR NERVE ULNAR (MISCELLANEOUS) ×1 IMPLANT
SET HNDPC FAN SPRY TIP SCT (DISPOSABLE) ×1 IMPLANT
SET PAD KNEE POSITIONER (MISCELLANEOUS) ×1 IMPLANT
SPIKE FLUID TRANSFER (MISCELLANEOUS) ×2 IMPLANT
SUT MNCRL AB 4-0 PS2 18 (SUTURE) ×1 IMPLANT
SUT STRATAFIX PDS+ 0 24IN (SUTURE) ×1 IMPLANT
SUT VIC AB 1 CT1 36 (SUTURE) ×1 IMPLANT
SUT VIC AB 2-0 CT1 27 (SUTURE) ×2
SUT VIC AB 2-0 CT1 TAPERPNT 27 (SUTURE) ×2 IMPLANT
SYR 3ML LL SCALE MARK (SYRINGE) ×1 IMPLANT
TOWEL GREEN STERILE FF (TOWEL DISPOSABLE) ×1 IMPLANT
TRAY FOLEY MTR SLVR 16FR STAT (SET/KITS/TRAYS/PACK) ×1 IMPLANT
TUBE SUCTION HIGH CAP CLEAR NV (SUCTIONS) ×1 IMPLANT
WATER STERILE IRR 1000ML POUR (IV SOLUTION) ×2 IMPLANT
WRAP KNEE MAXI GEL POST OP (GAUZE/BANDAGES/DRESSINGS) ×1 IMPLANT

## 2023-01-12 NOTE — Anesthesia Procedure Notes (Signed)
Anesthesia Regional Block: Adductor canal block   Pre-Anesthetic Checklist: , timeout performed,  Correct Patient, Correct Site, Correct Laterality,  Correct Procedure, Correct Position, site marked,  Risks and benefits discussed,  Surgical consent,  Pre-op evaluation,  At surgeon's request and post-op pain management  Laterality: Right  Prep: chloraprep       Needles:  Injection technique: Single-shot  Needle Type: Echogenic Needle     Needle Length: 9cm  Needle Gauge: 21     Additional Needles:   Procedures:,,,, ultrasound used (permanent image in chart),,    Narrative:  Start time: 01/12/2023 6:42 AM End time: 01/12/2023 6:47 AM Injection made incrementally with aspirations every 5 mL.  Performed by: Personally  Anesthesiologist: Marcene Duos, MD

## 2023-01-12 NOTE — Evaluation (Signed)
Physical Therapy Evaluation Patient Details Name: Valerie Chung MRN: [id number] DOB: [birth year] Today's Date: 01/12/2023  History of Present Illness  72 yo female presents to therapy s/p R TKA on 01/12/2023 due to failure of conservative measures following a fall 2023.  Pt has PMH including but not limited to: angina, CAD, HDL, LBP s/p sx, and OSA.  Clinical Impression    Jalexia Lalli is a 72 y.o. female POD 0 s/p R TKA. Patient reports mod I with SPC with mobility at baseline. Patient is now limited by functional impairments (see PT problem list below) and requires min guard for bed mobility and mod A cues and RW  for transfers. Patient was unable to ambulate due to R LE instability. PT to return later in the day as time allows to complete PT assessment for safe functional mobility. PT made nurse aware of pt pain report, R LE instability and R LE positioning with hip ER. Patient will benefit from continued skilled PT interventions to address impairments and progress towards PLOF. Acute PT will follow to progress mobility and stair training in preparation for safe discharge home with family support and OPPT starting 4/26.      Recommendations for follow up therapy are one component of a multi-disciplinary discharge planning process, led by the attending physician.  Recommendations may be updated based on patient status, additional functional criteria and insurance authorization.  Follow Up Recommendations       Assistance Recommended at Discharge Intermittent Supervision/Assistance  Patient can return home with the following  A little help with walking and/or transfers;A little help with bathing/dressing/bathroom;Assistance with cooking/housework;Assist for transportation;Help with stairs or ramp for entrance    Equipment Recommendations Rolling walker (2 wheels)  Recommendations for Other Services       Functional Status Assessment Patient has had a recent decline in their functional status  and demonstrates the ability to make significant improvements in function in a reasonable and predictable amount of time.     Precautions / Restrictions Precautions Precautions: Knee;Fall Restrictions Weight Bearing Restrictions: No      Mobility  Bed Mobility Overal bed mobility: Needs Assistance Bed Mobility: Supine to Sit     Supine to sit: Min guard     General bed mobility comments: min cues, HOB elevated    Transfers Overall transfer level: Needs assistance Equipment used: Rolling walker (2 wheels) Transfers: Bed to chair/wheelchair/BSC   Stand pivot transfers: Mod assist         General transfer comment: pt demonstrated R LE instability once in standing and required mod A for safety with cues for use of B UE support at RW to offload R LE to pivot to recliner    Ambulation/Gait               General Gait Details: NT due to R quad instability suspect slow regression of spinal  Stairs            Wheelchair Mobility    Modified Rankin (Stroke Patients Only)       Balance Overall balance assessment: Needs assistance, History of Falls Sitting-balance support: Feet supported Sitting balance-Leahy Scale: Good     Standing balance support: Bilateral upper extremity supported, During functional activity, Reliant on assistive device for balance Standing balance-Leahy Scale: Poor                               Pertinent Vitals/Pain Pain Assessment Pain Assessment: 0-10 Pain  Score: 7  (no pain when PT arrived and pt sleeping, once seated in recliner pt reported cramping R LE pain) Pain Location: L LE Pain Descriptors / Indicators: Cramping, Discomfort, Guarding, Grimacing, Operative site guarding Pain Intervention(s): Limited activity within patient's tolerance, Monitored during session, Repositioned, Ice applied    Home Living Family/patient expects to be discharged to:: Private residence Living Arrangements: Spouse/significant  other Available Help at Discharge: Family Type of Home: House Home Access: Level entry       Home Layout: Two level;Able to live on main level with bedroom/bathroom Home Equipment: Cane - single point      Prior Function Prior Level of Function : Independent/Modified Independent             Mobility Comments: mod I with SPC for home and community mod I with all ADLs, self care tasks, IADLs ADLs Comments: reports use of SPT for STS     Hand Dominance        Extremity/Trunk Assessment        Lower Extremity Assessment Lower Extremity Assessment: RLE deficits/detail RLE Deficits / Details: ankle Df/PF 5/5; SLR > 10 degree lag noted limited quad stability once in standing RLE Sensation: decreased light touch (pt reported abn sensation once in standing)    Cervical / Trunk Assessment Cervical / Trunk Assessment: Normal  Communication   Communication: No difficulties  Cognition Arousal/Alertness: Awake/alert Behavior During Therapy: WFL for tasks assessed/performed Overall Cognitive Status: Within Functional Limits for tasks assessed                                          General Comments      Exercises     Assessment/Plan    PT Assessment Patient needs continued PT services  PT Problem List Decreased strength;Decreased range of motion;Decreased activity tolerance;Decreased balance;Decreased mobility;Decreased coordination;Decreased cognition;Pain       PT Treatment Interventions DME instruction;Gait training;Functional mobility training;Therapeutic activities;Therapeutic exercise;Balance training;Neuromuscular re-education;Patient/family education;Modalities    PT Goals (Current goals can be found in the Care Plan section)  Acute Rehab PT Goals Patient Stated Goal: to be able [facility] walk without [facility] Goal Formulation: With patient Time For Goal Achievement: 01/26/23 Potential to Achieve Goals: Good     Frequency 7X/week     Co-evaluation               AM-PAC PT "6 Clicks" Mobility  Outcome Measure Help needed turning from your back to your side while in a flat bed without using bedrails?: A Little Help needed moving from lying on your back to sitting on the side of a flat bed without using bedrails?: A Little Help needed moving to and from a bed to a chair (including a wheelchair)?: A Little Help needed standing up from a chair using your arms (e.g., wheelchair or bedside chair)?: A Lot Help needed to walk in hospital room?: Total Help needed climbing 3-5 steps with a railing? : Total 6 Click Score: 13    End of Session Equipment Utilized During Treatment: Gait belt Activity Tolerance: Treatment limited secondary to medical complications (Comment) (R LE instability)     PT Visit Diagnosis: Unsteadiness on feet (R26.81);Muscle weakness (generalized) (M62.81);Pain;History of falling (Z91.81) Pain - Right/Left: Right Pain - part of body: Knee    Time: 1610-9604 PT Time Calculation (min) (ACUTE ONLY): 35 min  Charges:   PT Evaluation $PT Eval Low Complexity: 1 Low PT Treatments $Therapeutic Activity: 8-22 mins        Rica Mote, PT   Jacqualyn Posey 01/12/2023, 3:04 PM

## 2023-01-12 NOTE — Transfer of Care (Signed)
Immediate Anesthesia Transfer of Care Note  Patient: Marlaya Turck  Procedure(s) Performed: TOTAL KNEE ARTHROPLASTY (Right: Knee)  Patient Location: PACU  Anesthesia Type:Spinal and MAC combined with regional for post-op pain  Level of Consciousness: awake, alert , and patient cooperative  Airway & Oxygen Therapy: Patient Spontanous Breathing and Patient connected to nasal cannula oxygen  Post-op Assessment: Report given to RN and Post -op Vital signs reviewed and stable  Post vital signs: Reviewed and stable  Last Vitals:  Vitals Value Taken Time  BP    Temp    Pulse 71 01/12/23 0903  Resp 16 01/12/23 0903  SpO2 97 % 01/12/23 0903  Vitals shown include unvalidated device data.  Last Pain:  Vitals:   01/12/23 0619  TempSrc: Oral         Complications: No notable events documented.

## 2023-01-12 NOTE — Discharge Instructions (Signed)

## 2023-01-12 NOTE — Progress Notes (Signed)
Physical Therapy Treatment Patient Details Name: Valerie Chung MRN: [id number] DOB: 26-Mar-1951 Today's Date: 01/12/2023   History of Present Illness 72 yo female presents to therapy s/p R TKA on 01/12/2023 due to failure of conservative measures. Pt has PMH including but not limited to: angina, CAD, HDL, LBP s/p sx, and OSA.    PT Comments     Valerie Chung is a 72 y.o. female POD 0 s/p R TKA. Patient reports IND with mobility at baseline. Patient is now limited by functional impairments (see PT problem list below) and requires mod A for sit to supine for bed mobility and max A for transfers from recliner and min A once standing to return to bed, pt able to perform side step to R with min A and heavy reliance on RW. Patient was unable to safely ambulate.   Patient will benefit from continued skilled PT interventions to address impairments and progress towards PLOF. Acute PT will follow to progress mobility and stair training in preparation for safe discharge home with family support.   Recommendations for follow up therapy are one component of a multi-disciplinary discharge planning process, led by the attending physician.  Recommendations may be updated based on patient status, additional functional criteria and insurance authorization.  Follow Up Recommendations       Assistance Recommended at Discharge Intermittent Supervision/Assistance  Patient can return home with the following A little help with walking and/or transfers;A little help with bathing/dressing/bathroom;Assistance with cooking/housework;Assist for transportation;Help with stairs or ramp for entrance   Equipment Recommendations  Rolling walker (2 wheels)    Recommendations for Other Services       Precautions / Restrictions Precautions Precautions: Knee;Fall Restrictions Weight Bearing Restrictions: No     Mobility  Bed Mobility Overal bed mobility: Needs Assistance Bed Mobility: Sit to Supine     Supine to sit:  Mod assist (for R LE)     General bed mobility comments: pt seated in recliner when PT arrived    Transfers Overall transfer level: Needs assistance Equipment used: Rolling walker (2 wheels) Transfers: Bed to chair/wheelchair/BSC   Stand pivot transfers: Max assist         General transfer comment: pt demonstrated R LE instability once in standing and required max A for STS from recliner, once in standing min A with cues for use of B UE support at RW to offload R LE to pivot to recliner, pt able to side step to the R with min A and cues heavy reliance on RW    Ambulation/Gait               General Gait Details: NT due to R quad instability suspect slow regression of spinal   Stairs             Wheelchair Mobility    Modified Rankin (Stroke Patients Only)       Balance Overall balance assessment: Needs assistance, History of Falls Sitting-balance support: Feet supported Sitting balance-Leahy Scale: Good     Standing balance support: Bilateral upper extremity supported, During functional activity, Reliant on assistive device for balance Standing balance-Leahy Scale: Poor                              Cognition Arousal/Alertness: Awake/alert Behavior During Therapy: WFL for tasks assessed/performed Overall Cognitive Status: Within Functional Limits for tasks assessed  Exercises      General Comments        Pertinent Vitals/Pain Pain Assessment Pain Assessment: 0-10 Pain Score: 4  Pain Location: L LE Pain Descriptors / Indicators: Cramping, Discomfort, Guarding, Grimacing, Operative site guarding Pain Intervention(s): Limited activity within patient's tolerance, Monitored during session, Repositioned, Ice applied (mm relaxer ~ 2 hrs prior to PT intervention.)    Home Living Family/patient expects to be discharged to:: Private residence Living Arrangements: Spouse/significant  other Available Help at Discharge: Family Type of Home: House Home Access: Level entry       Home Layout: Two level;Able to live on main level with bedroom/bathroom Home Equipment: Gilmer Mor - single point      Prior Function            PT Goals (current goals can now be found in the care plan section) Acute Rehab PT Goals Patient Stated Goal: to be able to walk without pain [facility] Goal Formulation: With patient Time For Goal Achievement: 01/26/23 Potential to Achieve Goals: Good    Frequency    7X/week      PT Plan      Co-evaluation              AM-PAC PT "6 Clicks" Mobility   Outcome Measure  Help needed turning from your back to your side while in a flat bed without using bedrails?: A Little Help needed moving from lying on your back to sitting on the side of a flat bed without using bedrails?: A Little Help needed moving to and from a bed to a chair (including a wheelchair)?: A Little Help needed standing up from a chair using your arms (e.g., wheelchair or bedside chair)?: A Lot Help needed to walk in hospital room?: Total Help needed climbing 3-5 steps with a railing? : Total 6 Click Score: 13    End of Session Equipment Utilized During Treatment: Gait belt Activity Tolerance: Treatment limited secondary to medical complications (Comment) (R LE instability) Patient left: in bed;with call bell/phone within reach;with family/visitor present Nurse Communication: Mobility status;Other (comment) (R LE positioning and instability) PT Visit Diagnosis: Unsteadiness on feet (R26.81);Muscle weakness (generalized) (M62.81);Pain;History of falling (Z91.81) Pain - Right/Left: Right Pain - part of body: Knee     Time: 1610-9604 PT Time Calculation (min) (ACUTE ONLY): 16 min  Charges:  $Therapeutic Activity: 8-22 mins                     Rica Mote, PT    Jacqualyn Posey 01/12/2023, 5:33 PM

## 2023-01-12 NOTE — Anesthesia Preprocedure Evaluation (Signed)
Anesthesia Evaluation  Patient identified by MRN, date of birth, ID band Patient awake    Reviewed: Allergy & Precautions, NPO status , Patient's Chart, lab work & pertinent test results  Airway Mallampati: II  TM Distance: >3 FB Neck ROM: Full    Dental  (+) Dental Advisory Given   Pulmonary sleep apnea , former smoker   breath sounds clear to auscultation       Cardiovascular hypertension, Pt. on medications and Pt. on home beta blockers  Rhythm:Regular Rate:Normal     Neuro/Psych negative neurological ROS     GI/Hepatic negative GI ROS, Neg liver ROS,,,  Endo/Other  negative endocrine ROS    Renal/GU negative Renal ROS     Musculoskeletal  (+) Arthritis ,    Abdominal   Peds  Hematology negative hematology ROS (+)   Anesthesia Other Findings   Reproductive/Obstetrics                             Anesthesia Physical Anesthesia Plan  ASA: 2  Anesthesia Plan: Spinal   Post-op Pain Management: Regional block* and Tylenol PO (pre-op)*   Induction:   PONV Risk Score and Plan: 2 and Propofol infusion and Ondansetron  Airway Management Planned: Natural Airway and Simple Face Mask  Additional Equipment:   Intra-op Plan:   Post-operative Plan:   Informed Consent: I have reviewed the patients History and Physical, chart, labs and discussed the procedure including the risks, benefits and alternatives for the proposed anesthesia with the patient or authorized representative who has indicated his/her understanding and acceptance.       Plan Discussed with: CRNA  Anesthesia Plan Comments:        Anesthesia Quick Evaluation

## 2023-01-12 NOTE — Anesthesia Postprocedure Evaluation (Signed)
Anesthesia Post Note  Patient: Jazleen Robeck  Procedure(s) Performed: TOTAL KNEE ARTHROPLASTY (Right: Knee)     Patient location during evaluation: PACU Anesthesia Type: Spinal Level of consciousness: awake and alert Pain management: pain level controlled Vital Signs Assessment: post-procedure vital signs reviewed and stable Respiratory status: spontaneous breathing and respiratory function stable Cardiovascular status: blood pressure returned to baseline and stable Postop Assessment: spinal receding Anesthetic complications: no  No notable events documented.  Last Vitals:  Vitals:   01/12/23 1030 01/12/23 1215  BP: 131/76 (!) 140/73  Pulse: (!) 50 (!) 57  Resp: 14 16  Temp: (!) 36.2 C 36.5 C  SpO2: 98% 99%    Last Pain:  Vitals:   01/12/23 1225  TempSrc:   PainSc: 5                  Kennieth Rad

## 2023-01-12 NOTE — Care Plan (Signed)
Ortho Bundle Case Management Note  Patient Details  Name: Valerie Chung MRN: [id number] Date of Birth: Jun 06, 1951                  R TKA on 01/12/23.  DCP: Home with husband.  DME: RW ordered through [employer].  PT: Roseanne Reno PT [facility] 4/26   DME Arranged:  Dan Humphreys rolling DME Agency:  [employer]    Additional Comments: Please contact me with any questions of if this plan should need to change.    Despina Pole, Case Manager  [facility]  [phone] 01/12/2023, 12:44 PM

## 2023-01-12 NOTE — Plan of Care (Signed)
  Problem: Education: Goal: Knowledge of General Education information will improve Description Including pain rating scale, medication(s)/side effects and non-pharmacologic comfort measures Outcome: Progressing   

## 2023-01-12 NOTE — H&P (Signed)
TOTAL KNEE ADMISSION H&P  Patient is being admitted for right total knee arthroplasty.  Subjective:  Chief Complaint:right knee pain.  HPI: Valerie Chung, 72 y.o. female, has a history of pain and functional disability in the right knee due to arthritis and has failed non-surgical conservative treatments for greater than 12 weeks to includeNSAID's and/or analgesics, corticosteriod injections, and activity modification.  Onset of symptoms was gradual, starting 2 years ago with gradually worsening course since that time. The patient noted no past surgery on the right knee(s).  Patient currently rates pain in the right knee(s) at 8 out of 10 with activity. Patient has worsening of pain with activity and weight bearing and pain that interferes with activities of daily living.  Patient has evidence of joint space narrowing by imaging studies. There is no active infection.  Patient Active Problem List   Diagnosis Date Noted   Chest pain 10/30/2016   Coronary artery disease involving native coronary artery of native heart with angina pectoris 10/30/2016   Dyslipidemia (high LDL; low HDL) 10/30/2016   Past Medical History:  Diagnosis Date   Arthritis    Back pain    low   Complication of anesthesia    slow to wake up   Hyperlipemia    Hypertension    Sleep apnea     Past Surgical History:  Procedure Laterality Date   BACK SURGERY     BREAST EXCISIONAL BIOPSY Right     Current Facility-Administered Medications  Medication Dose Route Frequency Provider Last Rate Last Admin   ceFAZolin (ANCEF) IVPB 2g/100 mL premix  2 g Intravenous On Call to OR Cassandria Anger, PA-C       dexamethasone (DECADRON) injection 8 mg  8 mg Intravenous Once Cassandria Anger, PA-C       lactated ringers infusion   Intravenous Continuous Cassandria Anger, PA-C 75 mL/hr at 01/12/23 0612 New Bag at 01/12/23 0612   lactated ringers infusion   Intravenous Continuous Hodierne, Adam, MD       povidone-iodine 10 %  swab 2 Application  2 Application Topical Once Cassandria Anger, PA-C       tranexamic acid (CYKLOKAPRON) IVPB 1,000 mg  1,000 mg Intravenous To OR Cassandria Anger, PA-C       Allergies  Allergen Reactions   Tramadol Nausea And Vomiting    Social History   Tobacco Use   Smoking status: Former   Smokeless tobacco: Never  Substance Use Topics   Alcohol use: Yes    Comment: occasional    Family History  Problem Relation Age of Onset   Cancer Mother      Review of Systems  Constitutional:  Negative for chills and fever.  Respiratory:  Negative for cough and shortness of breath.   Cardiovascular:  Negative for chest pain.  Gastrointestinal:  Negative for nausea and vomiting.  Musculoskeletal:  Positive for arthralgias.     Objective:  Physical Exam Well nourished and well developed. General: Alert and oriented x3, cooperative and pleasant, no acute distress. Head: normocephalic, atraumatic, neck supple. Eyes: EOMI.  Musculoskeletal: Right knee exam: No palpable effusion, warmth erythema Tenderness over the lateral and anterior aspect knee Slight flexion contracture with flexion limited to 110 degrees today with tightness and discomfort   Calves soft and nontender. Motor function intact in LE. Strength 5/5 LE bilaterally. Neuro: Distal pulses 2+. Sensation to light touch intact in LE.  Vital signs in last 24 hours: Weight:  [113.4 kg] 113.4  kg (04/23 0601)  Labs:   Estimated body mass index is 36.92 kg/m as calculated from the following:   Height as of this encounter:  (1.753 m).   Weight as of this encounter: 113.4 kg.   Imaging Review Plain radiographs demonstrate severe degenerative joint disease of the right knee(s). The overall alignment isneutral. The bone quality appears to be adequate for age and reported activity level.      Assessment/Plan:  End stage arthritis, right knee   The patient history, physical examination, clinical judgment  of the provider and imaging studies are consistent with end stage degenerative joint disease of the right knee(s) and total knee arthroplasty is deemed medically necessary. The treatment options including medical management, injection therapy arthroscopy and arthroplasty were discussed at length. The risks and benefits of total knee arthroplasty were presented and reviewed. The risks due to aseptic loosening, infection, stiffness, patella tracking problems, thromboembolic complications and other imponderables were discussed. The patient acknowledged the explanation, agreed to proceed with the plan and consent was signed. Patient is being admitted for inpatient treatment for surgery, pain control, PT, OT, prophylactic antibiotics, VTE prophylaxis, progressive ambulation and ADL's and discharge planning. The patient is planning to be discharged  home.  Therapy Plans: outpatient therapy at [facility] 4/26 Disposition: Home with husband Planned DVT Prophylaxis: aspirin  BID DME needed: walker PCP: Dr. Jacqulyn Bath, clearance received TXA: IV Allergies: sick with tramadol Anesthesia Concerns: slow to wake up from anesthesia BMI: 38.1 Last HgbA1c: Not diabetic   Other: - Tells me that she goes into a 'deep sleep' when she is stressed - staying overnight - Hx of ESI - does have right buttock pain - in [place] - No hx of VTE or cancer - oxycodone, robaxin, tylenol, celebrex - No hx of CKD or GI issues   Patient's anticipated LOS is less than 2 midnights, meeting these requirements: - Younger than 48 - Lives within 1 hour of care - Has a competent adult at home to recover with post-op recover - NO history of  - Chronic pain requiring opiods  - Diabetes  - Coronary Artery Disease  - Heart failure  - Heart attack  - Stroke  - DVT/VTE  - Cardiac arrhythmia  - Respiratory Failure/COPD  - Renal failure  - Anemia  - Advanced Liver disease  Rosalene Billings, PA-C Orthopedic  Surgery [facility] [place] [phone]

## 2023-01-12 NOTE — Interval H&P Note (Signed)
History and Physical Interval Note:  01/12/2023 6:55 AM  Valerie Chung  has presented today for surgery, with the diagnosis of Right knee osteoarthritis.  The various methods of treatment have been discussed with the patient and family. After consideration of risks, benefits and other options for treatment, the patient has consented to  Procedure(s): TOTAL KNEE ARTHROPLASTY (Right) as a surgical intervention.  The patient's history has been reviewed, patient examined, no change in status, stable for surgery.  I have reviewed the patient's chart and labs.  Questions were answered to the patient's satisfaction.     [name]

## 2023-01-12 NOTE — Op Note (Signed)
NAME:  Valerie Chung                      MEDICAL RECORD NO.:  [id number]                             FACILITY:  [facility]      PHYSICIAN:  Madlyn Frankel. Charlann Boxer, M.D.  DATE OF BIRTH:  01-Nov-1950      DATE OF PROCEDURE:  01/12/2023                                     OPERATIVE REPORT         PREOPERATIVE DIAGNOSIS:  Right knee osteoarthritis.      POSTOPERATIVE DIAGNOSIS:  Right knee osteoarthritis.      FINDINGS:  The patient was noted to have complete loss of cartilage and   bone-on-bone arthritis with associated osteophytes in the lateral and patellofemoral compartments of   the knee with a significant synovitis and associated effusion.  The patient had failed months of conservative treatment including medications, injection therapy, activity modification.     PROCEDURE:  Right total knee replacement.      COMPONENTS USED:  DePuy Attune FB CR MS knee   system, a size 5N femur, 5 tibia, size 6 mm PS AOX insert, and 35 anatomic patellar   button.      SURGEON:  Madlyn Frankel. Charlann Boxer, M.D.      ASSISTANT:  Rosalene Billings, PA-C.      ANESTHESIA:  Regional and Spinal.      SPECIMENS:  None.      COMPLICATION:  None.      DRAINS:  None.  EBL: <100 cc      TOURNIQUET TIME:   Total Tourniquet Time Documented: Thigh (Right) - 32 minutes Total: Thigh (Right) - 32 minutes  .      The patient was stable to the recovery room.      INDICATION FOR PROCEDURE:  Valerie Chung is a 72 y.o. female patient of   mine.  The patient had been seen, evaluated, and treated for months conservatively in the   office with medication, activity modification, and injections.  The patient had   radiographic changes of bone-on-bone arthritis with endplate sclerosis and osteophytes noted.  Based on the radiographic changes and failed conservative measures, the patient   decided to proceed with definitive treatment, total knee replacement.  Risks of infection, DVT, component failure, need for revision surgery,  neurovascular injury were reviewed in the office setting.  The postop course was reviewed stressing the efforts to maximize post-operative satisfaction and function.  Consent was obtained for benefit of pain   relief.      PROCEDURE IN DETAIL:  The patient was brought to the operative theater.   Once adequate anesthesia, preoperative antibiotics, 2 gm of Ancef,1 gm of Tranexamic Acid, and 10 mg of Decadron administered, the patient was positioned supine with a right thigh tourniquet placed.  The  right lower extremity was prepped and draped in sterile fashion.  A time-   out was performed identifying the patient, planned procedure, and the appropriate extremity.      The right lower extremity was placed in the [facility] leg holder.  The leg was   exsanguinated, tourniquet elevated to 225 mmHg.  A midline incision was   made followed by  median parapatellar arthrotomy.  Following initial   exposure, attention was first directed to the patella.  Precut   measurement was noted to be 22 mm.  I resected down to 13 mm and used a   35 anatomic patellar button to restore patellar height as well as cover the cut surface.      The lug holes were drilled and a metal shim was placed to protect the   patella from retractors and saw blade during the procedure.      At this point, attention was now directed to the femur.  The femoral   canal was opened with a drill, irrigated to try to prevent fat emboli.  An   intramedullary rod was passed at 3 degrees valgus, 9 mm of bone was   resected off the distal femur.  Following this resection, the tibia was   subluxated anteriorly.  Using the extramedullary guide, 3 mm of bone was resected off   the proximal medial tibia.  We confirmed the gap would be   stable medially and laterally with a size 5 spacer block as well as confirmed that the tibial cut was perpendicular in the coronal plane, checking with an alignment rod.      Once this was done, I sized the femur to  be a size 5 in the anterior-   posterior dimension, chose a narrow component based on medial and   lateral dimension.  The size 5 rotation block was then pinned in   position anterior referenced using the C-clamp to set rotation.  The   anterior, posterior, and  chamfer cuts were made without difficulty nor   notching making certain that I was along the anterior cortex to help   with flexion gap stability.      The final shim cut was made off the lateral aspect of distal femur.      At this point, the tibia was sized to be a size 5.  The size 5 tray was   then pinned after setting rotation of the tibial tray after floating the trial tray in a trial reduction then drilled, and keel punched.  Trial reduction was now carried with a 5 femur,  5 tibia, a size 6 mm CR MS insert, and the 35 anatomic patella botton.  The knee was brought to full extension with good flexion stability with the patella   tracking through the trochlea without application of pressure.  Given   all these findings the trial components removed.  Final components were   opened and cement was mixed.  The knee was irrigated with normal saline solution and pulse lavage.  The synovial lining was   then injected with 30 cc of 0.25% Marcaine with epinephrine, 1 cc of Toradol and 30 cc of NS for a total of 61 cc.     Final implants were then cemented onto cleaned and dried cut surfaces of bone with the knee brought to extension with a size 6 mm CR MS trial insert.      Once the cement had fully cured, excess cement was removed   throughout the knee.  I confirmed that I was satisfied with the range of   motion and stability, and the final size 6 mm CR MS AOX insert was chosen.  It was   placed into the knee.      The tourniquet had been let down at 32 minutes.  No significant   hemostasis was required.  The extensor mechanism  was then reapproximated using #1 Vicryl and #1 Stratafix sutures with the knee   in flexion.  The    remaining wound was closed with 2-0 Vicryl and running 4-0 Monocryl.   The knee was cleaned, dried, dressed sterilely using Dermabond and   Aquacel dressing.  The patient was then   brought to recovery room in stable condition, tolerating the procedure   well.   Please note that Physician Assistant, Rosalene Billings, PA-C was present for the entirety of the case, and was utilized for pre-operative positioning, peri-operative retractor management, general facilitation of the procedure and for primary wound closure at the end of the case.              Madlyn Frankel Charlann Boxer, M.D.    01/12/2023 8:37 AM

## 2023-01-12 NOTE — Anesthesia Procedure Notes (Signed)
Procedure Name: MAC Date/Time: 01/12/2023 7:10 AM  Performed by: Wynonia Sours, CRNAPre-anesthesia Checklist: Patient identified, Emergency Drugs available, Suction available, Patient being monitored and Timeout performed Patient Re-evaluated:Patient Re-evaluated prior to induction Oxygen Delivery Method: Simple face mask Preoxygenation: Pre-oxygenation with 100% oxygen Placement Confirmation: positive ETCO2 Dental Injury: Teeth and Oropharynx as per pre-operative assessment

## 2023-01-12 NOTE — Anesthesia Procedure Notes (Signed)
Spinal  Patient location during procedure: OR Start time: 01/12/2023 7:15 AM End time: 01/12/2023 7:20 AM Reason for block: surgical anesthesia Staffing Performed: anesthesiologist  Anesthesiologist: Marcene Duos, MD Performed by: Marcene Duos, MD Authorized by: Marcene Duos, MD   Preanesthetic Checklist Completed: patient identified, IV checked, site marked, risks and benefits discussed, surgical consent, monitors and equipment checked, pre-op evaluation and timeout performed Spinal Block Patient position: sitting Prep: DuraPrep Patient monitoring: heart rate, cardiac monitor, continuous pulse ox and blood pressure Approach: midline Location: L4-5 Injection technique: single-shot Needle Needle type: Pencan  Needle gauge: 24 G Needle length: 9 cm Assessment Sensory level: T4 Events: CSF return and second provider

## 2023-01-13 ENCOUNTER — Encounter (HOSPITAL_COMMUNITY): Payer: Self-pay | Admitting: Orthopedic Surgery

## 2023-01-13 DIAGNOSIS — I10 Essential (primary) hypertension: Secondary | ICD-10-CM | POA: Diagnosis not present

## 2023-01-13 DIAGNOSIS — I2510 Atherosclerotic heart disease of native coronary artery without angina pectoris: Secondary | ICD-10-CM | POA: Diagnosis not present

## 2023-01-13 DIAGNOSIS — Z87891 Personal history of nicotine dependence: Secondary | ICD-10-CM | POA: Diagnosis not present

## 2023-01-13 DIAGNOSIS — M1711 Unilateral primary osteoarthritis, right knee: Secondary | ICD-10-CM | POA: Diagnosis not present

## 2023-01-13 LAB — BASIC METABOLIC PANEL
Anion gap: 9 (ref 5–15)
BUN: 14 mg/dL (ref 8–23)
CO2: 26 mmol/L (ref 22–32)
Calcium: 8.4 mg/dL — ABNORMAL LOW (ref 8.9–10.3)
Chloride: 104 mmol/L (ref 98–111)
Creatinine, Ser: 0.86 mg/dL (ref 0.44–1.00)
GFR, Estimated: >60 mL/min (ref >60)
Glucose, Bld: 150 mg/dL — ABNORMAL HIGH (ref 70–99)
Potassium: 3.6 mmol/L (ref 3.5–5.1)
Sodium: 139 mmol/L (ref 135–145)

## 2023-01-13 LAB — CBC
HCT: 36.7 % (ref 36.0–46.0)
Hemoglobin: 12 g/dL (ref 12.0–15.0)
MCH: 30.8 pg (ref 26.0–34.0)
MCHC: 32.7 g/dL (ref 30.0–36.0)
MCV: 94.1 fL (ref 80.0–100.0)
Platelets: 287 10*3/uL (ref 150–400)
RBC: 3.9 MIL/uL (ref 3.87–5.11)
RDW: 13.5 % (ref 11.5–15.5)
WBC: 9.6 10*3/uL (ref 4.0–10.5)
nRBC: 0 % (ref 0.0–0.2)

## 2023-01-13 MED ORDER — POLYETHYLENE GLYCOL 3350 17 G PO PACK: 17.0000 g | PACK | Freq: Two times a day (BID) | ORAL | 0 refills | Status: AC

## 2023-01-13 MED ORDER — ASPIRIN 81 MG PO CHEW: 81.0000 mg | CHEWABLE_TABLET | Freq: Two times a day (BID) | ORAL | 0 refills | Status: AC

## 2023-01-13 MED ORDER — CELECOXIB 200 MG PO CAPS: 200.0000 mg | ORAL_CAPSULE | Freq: Two times a day (BID) | ORAL | 0 refills | Status: DC

## 2023-01-13 MED ORDER — METHOCARBAMOL 500 MG PO TABS: 500.0000 mg | ORAL_TABLET | Freq: Four times a day (QID) | ORAL | 2 refills | Status: DC | PRN

## 2023-01-13 MED ORDER — OXYCODONE HCL 5 MG PO TABS: 5.0000 mg | ORAL_TABLET | ORAL | 0 refills | Status: AC | PRN

## 2023-01-13 MED ORDER — SENNA 8.6 MG PO TABS: 2.0000 | ORAL_TABLET | Freq: Every day | ORAL | 0 refills | Status: AC

## 2023-01-13 NOTE — TOC Transition Note (Signed)
Transition of Care [facility]) - CM/SW Discharge Note   Patient Details  Name: Valerie Chung MRN: [id number] Date of Birth: Mar 10, 1951  [facility] CM/SW Contact:  Amada Jupiter, LCSW Phone Number: 01/13/2023, 10:12 AM   Clinical Narrative:     Met with pt and confirming she has received RW to room via [employer].  OPPT already set up with [facility].  No further TOC needs.  Final next level of care: OP Rehab Barriers to Discharge: No Barriers Identified   Patient Goals and CMS Choice      Discharge Placement                         Discharge Plan and Services Additional resources added to the After Visit Summary for                  DME Arranged: Walker rolling DME Agency: [employer]                  Social Determinants of Health (SDOH) Interventions SDOH Screenings   Food Insecurity: No Food Insecurity (01/12/2023)  Housing: Low Risk  (01/12/2023)  Transportation Needs: No Transportation Needs (01/12/2023)  Utilities: Not At Risk (01/12/2023)  Tobacco Use: Medium Risk (01/12/2023)     Readmission Risk Interventions     No data to display

## 2023-01-13 NOTE — Progress Notes (Signed)
   Subjective: 1 Day Post-Op Procedure(s) (LRB): TOTAL KNEE ARTHROPLASTY (Right) Patient reports pain as mild.   Patient seen in rounds with Dr. Charlann Boxer. Patient is well, and has had no acute complaints or problems. No acute events overnight. Foley catheter removed. Patient ambulated a few feet with PT.  We will continue therapy today.   Objective: Vital signs in last 24 hours: Temp:  [96.8 F (36 C)-98 F (36.7 C)] 97.7 F (36.5 C) (04/24 0636) Pulse Rate:  [50-71] 55 (04/24 0636) Resp:  [14-17] 16 (04/24 0636) BP: (108-148)/(68-97) 126/81 (04/24 0636) SpO2:  [91 %-99 %] 91 % (04/24 0636) Weight:  [113.4 kg] 113.4 kg (04/23 1215)  Intake/Output from previous day:  Intake/Output Summary (Last 24 hours) at 01/13/2023 0720 Last data filed at 01/13/2023 0650 Gross per 24 hour  Intake 3336.2 ml  Output 1650 ml  Net 1686.2 ml     Intake/Output this shift: No intake/output data recorded.  Labs: Recent Labs    01/13/23 0328  HGB 12.0   Recent Labs    01/13/23 0328  WBC 9.6  RBC 3.90  HCT 36.7  PLT 287   Recent Labs    01/13/23 0328  NA 139  K 3.6  CL 104  CO2 26  BUN 14  CREATININE 0.86  GLUCOSE 150*  CALCIUM 8.4*   No results for input(s): "LABPT", "INR" in the last 72 hours.  Exam: General - Patient is Alert and Oriented Extremity - Neurologically intact Sensation intact distally Intact pulses distally Dorsiflexion/Plantar flexion intact Dressing - dressing C/D/I Motor Function - intact, moving foot and toes well on exam.   Past Medical History:  Diagnosis Date   Arthritis    Back pain    low   Complication of anesthesia    slow to wake up   Hyperlipemia    Hypertension    Sleep apnea     Assessment/Plan: 1 Day Post-Op Procedure(s) (LRB): TOTAL KNEE ARTHROPLASTY (Right) Principal Problem:   S/P total knee arthroplasty, right  Estimated body mass index is 36.92 kg/m as calculated from the following:   Height as of this encounter:   (1.753 m).   Weight as of this encounter: 113.4 kg. Advance diet Up with therapy D/C IV fluids   Patient's anticipated LOS is less than 2 midnights, meeting these requirements: - Younger than 19 - Lives within 1 hour of care - Has a competent adult at home to recover with post-op recover - NO history of  - Chronic pain requiring opiods  - Diabetes  - Coronary Artery Disease  - Heart failure  - Heart attack  - Stroke  - DVT/VTE  - Cardiac arrhythmia  - Respiratory Failure/COPD  - Renal failure  - Anemia  - Advanced Liver disease     DVT Prophylaxis - Aspirin Weight bearing as tolerated.  Hgb stable at 12 this AM.  Plan is to go Home after hospital stay. Plan for discharge today following 1-2 sessions of PT as long as they are meeting their goals. Patient is scheduled for OPPT. Follow up in the office in 2 weeks.   Dennie Bible, PA-C Orthopedic Surgery [phone] 01/13/2023, 7:20 AM

## 2023-01-13 NOTE — Progress Notes (Signed)
Physical Therapy Treatment Patient Details Name: Valerie Chung MRN: [id number] DOB: 03/05/1951 Today's Date: 01/13/2023   History of Present Illness 72 yo female presents to therapy s/p R TKA on 01/12/2023 due to failure of conservative measures. Pt has PMH including but not limited to: angina, CAD, HDL, LBP s/p sx, and OSA.    PT Comments    Pt progressing well today; assist with SLR however quad activation much improved with regional block effects having worn off;  reviewed HEP, gait, transfer safety with pt and caregiver. Pt feels ready to d/c with assist as needed.   Recommendations for follow up therapy are one component of a multi-disciplinary discharge planning process, led by the attending physician.  Recommendations may be updated based on patient status, additional functional criteria and insurance authorization.  Follow Up Recommendations       Assistance Recommended at Discharge Intermittent Supervision/Assistance  Patient can return home with the following A little help with walking and/or transfers;A little help with bathing/dressing/bathroom;Assistance with cooking/housework;Assist for transportation;Help with stairs or ramp for entrance   Equipment Recommendations  Rolling walker (2 wheels)    Recommendations for Other Services       Precautions / Restrictions Precautions Precautions: Knee;Fall Restrictions Weight Bearing Restrictions: No     Mobility  Bed Mobility Overal bed mobility: Needs Assistance Bed Mobility: Supine to Sit     Supine to sit: Min guard, Supervision     General bed mobility comments: for safety    Transfers Overall transfer level: Needs assistance Equipment used: Rolling walker (2 wheels) Transfers: Sit to/from Stand Sit to Stand: Supervision, Min guard           General transfer comment: cues for hand placement    Ambulation/Gait Ambulation/Gait assistance: Min guard, Supervision Gait Distance (Feet): 120 Feet Assistive  device: Rolling walker (2 wheels)         General Gait Details: cues for RW position, sequence   Stairs             Wheelchair Mobility    Modified Rankin (Stroke Patients Only)       Balance Overall balance assessment: Needs assistance, History of Falls Sitting-balance support: Feet supported Sitting balance-Leahy Scale: Good     Standing balance support: Bilateral upper extremity supported, During functional activity, Reliant on assistive device for balance Standing balance-Leahy Scale: Poor                              Cognition Arousal/Alertness: Awake/alert Behavior During Therapy: WFL for tasks assessed/performed Overall Cognitive Status: Within Functional Limits for tasks assessed                                          Exercises Total Joint Exercises Ankle Circles/Pumps: AROM, 10 reps, Both Quad Sets: AROM, Both, 5 reps Heel Slides: AAROM, AROM, Right, 10 reps Straight Leg Raises: AAROM, Right, 5 reps    General Comments        Pertinent Vitals/Pain Pain Assessment Pain Assessment: 0-10 Pain Score: 4  Pain Location: L LE Pain Descriptors / Indicators: Discomfort, Guarding, Grimacing    Home Living                          Prior Function            PT  Goals (current goals can now be found in the care plan section) Acute Rehab PT Goals Patient Stated Goal: to be able to walk without pain [facility] Goal Formulation: With patient Time For Goal Achievement: 01/26/23 Potential to Achieve Goals: Good Progress towards PT goals: Progressing toward goals    Frequency    7X/week      PT Plan      Co-evaluation              AM-PAC PT "6 Clicks" Mobility   Outcome Measure  Help needed turning from your back to your side while in a flat bed without using bedrails?: A Little Help needed moving from lying on your back to sitting on the side of a flat bed without  using bedrails?: A Little Help needed moving to and from a bed to a chair (including a wheelchair)?: A Little Help needed standing up from a chair using your arms (e.g., wheelchair or bedside chair)?: A Little Help needed to walk in hospital room?: A Little Help needed climbing 3-5 steps with a railing? : A Little 6 Click Score: 18    End of Session Equipment Utilized During Treatment: Gait belt Activity Tolerance: Patient tolerated treatment well Patient left: with call bell/phone within reach;with family/visitor present;in chair;with chair alarm set Nurse Communication: Mobility status PT Visit Diagnosis: Unsteadiness on feet (R26.81);Muscle weakness (generalized) (M62.81);Pain;History of falling (Z91.81) Pain - Right/Left: Right Pain - part of body: Knee     Time: 1100-1127 PT Time Calculation (min) (ACUTE ONLY): 27 min  Charges:  $Gait Training: 8-22 mins $Therapeutic Exercise: 8-22 mins                     Delice Bison, PT  Acute Rehab Dept ([facility]) [phone]  01/13/2023    [facility] 01/13/2023, 12:04 PM

## 2023-01-15 DIAGNOSIS — Z96651 Presence of right artificial knee joint: Secondary | ICD-10-CM | POA: Diagnosis not present

## 2023-01-15 DIAGNOSIS — M25561 Pain in right knee: Secondary | ICD-10-CM | POA: Diagnosis not present

## 2023-01-19 DIAGNOSIS — Z96651 Presence of right artificial knee joint: Secondary | ICD-10-CM | POA: Diagnosis not present

## 2023-01-19 DIAGNOSIS — M25561 Pain in right knee: Secondary | ICD-10-CM | POA: Diagnosis not present

## 2023-01-19 NOTE — Discharge Summary (Signed)
Patient ID: Valerie Chung MRN: [id number] DOB/AGE: May 09, 1951 72 y.o.  Admit date: 01/12/2023 Discharge date: 01/13/2023  Admission Diagnoses:  Right knee osteoarthritis  Discharge Diagnoses:  Principal Problem:   S/P total knee arthroplasty, right   Past Medical History:  Diagnosis Date   Arthritis    Back pain    low   Complication of anesthesia    slow to wake up   Hyperlipemia    Hypertension    Sleep apnea     Surgeries: Procedure(s): TOTAL KNEE ARTHROPLASTY on 01/12/2023   Consultants:   Discharged Condition: Improved  Hospital Course: Valerie Chung is an 72 y.o. female who was admitted 01/12/2023 for operative treatment ofS/P total knee arthroplasty, right. Patient has severe unremitting pain that affects sleep, daily activities, and work/hobbies. After pre-op clearance the patient was taken to the operating room on 01/12/2023 and underwent  Procedure(s): TOTAL KNEE ARTHROPLASTY.    Patient was given perioperative antibiotics:  Anti-infectives (From admission, onward)    Start     Dose/Rate Route Frequency Ordered Stop   01/12/23 1400  ceFAZolin (ANCEF) IVPB 2g/100 mL premix        2 g 200 mL/hr over 30 Minutes Intravenous Every 6 hours 01/12/23 1051 01/13/23 0255   01/12/23 0600  ceFAZolin (ANCEF) IVPB 2g/100 mL premix        2 g 200 mL/hr over 30 Minutes Intravenous On call to O.R. 01/12/23 0532 01/12/23 4098        Patient was given sequential compression devices, early ambulation, and chemoprophylaxis to prevent DVT. Patient worked with PT and was meeting their goals regarding safe ambulation and transfers.  Patient benefited maximally from hospital stay and there were no complications.    Recent vital signs: No data found.   Recent laboratory studies: No results for input(s): "WBC", "HGB", "HCT", "PLT", "NA", "K", "CL", "CO2", "BUN", "CREATININE", "GLUCOSE", "INR", "CALCIUM" in the last 72 hours.  Invalid input(s): "PT", "2"   Discharge Medications:    Allergies as of 01/13/2023       Reactions   Tramadol Nausea And Vomiting        Medication List     STOP taking these medications    aspirin EC 81 MG tablet Replaced by: aspirin 81 MG chewable tablet   traMADol 50 MG tablet Commonly known as: ULTRAM       TAKE these medications    aspirin 81 MG chewable tablet Chew 1 tablet (81 mg total) by mouth 2 (two) times daily for 28 days. Replaces: aspirin EC 81 MG tablet   atorvastatin 40 MG tablet Commonly known as: LIPITOR Take 40 mg by mouth daily.   celecoxib 200 MG capsule Commonly known as: CELEBREX Take 1 capsule (200 mg total) by mouth 2 (two) times daily.   methocarbamol 500 MG tablet Commonly known as: ROBAXIN Take 1 tablet (500 mg total) by mouth every 6 (six) hours as needed for muscle spasms.   metoprolol succinate 25 MG 24 hr tablet Commonly known as: TOPROL-XL Take 25 mg by mouth daily.   oxyCODONE 5 MG immediate release tablet Commonly known as: Oxy IR/ROXICODONE Take 1 tablet (5 mg total) by mouth every 4 (four) hours as needed for severe pain.   polyethylene glycol 17 g packet Commonly known as: MIRALAX / GLYCOLAX Take 17 g by mouth 2 (two) times daily.   senna 8.6 MG Tabs tablet Commonly known as: SENOKOT Take 2 tablets (17.2 mg total) by mouth at bedtime for 14 days.  Discharge Care Instructions  (From admission, onward)           Start     Ordered   01/13/23 0000  Change dressing       Comments: Maintain surgical dressing until follow up in the clinic. If the edges start to pull up, may reinforce with tape. If the dressing is no longer working, may remove and cover with gauze and tape, but must keep the area dry and clean.  Call with any questions or concerns.   01/13/23 0723            Diagnostic Studies: No results found.  Disposition: Discharge disposition: 01-Home or Self Care       Discharge Instructions     Call MD / Call 911   Complete by:  As directed    If you experience chest pain or shortness of breath, CALL 911 and be transported to the hospital emergency room.  If you develope a fever above 101 F, pus (white drainage) or increased drainage or redness at the wound, or calf pain, call your surgeon's office.   Change dressing   Complete by: As directed    Maintain surgical dressing until follow up in the clinic. If the edges start to pull up, may reinforce with tape. If the dressing is no longer working, may remove and cover with gauze and tape, but must keep the area dry and clean.  Call with any questions or concerns.   Constipation Prevention   Complete by: As directed    Drink plenty of fluids.  Prune juice may be helpful.  You may use a stool softener, such as Colace (over the counter) 100 mg twice a day.  Use MiraLax (over the counter) for constipation as needed.   Diet - low sodium heart healthy   Complete by: As directed    Increase activity slowly as tolerated   Complete by: As directed    Weight bearing as tolerated with assist device (walker, cane, etc) as directed, use it as long as suggested by your surgeon or therapist, typically at least 4-6 weeks.   Post-operative opioid taper instructions:   Complete by: As directed    POST-OPERATIVE OPIOID TAPER INSTRUCTIONS: It is important to wean off of your opioid medication as soon as possible. If you do not need pain medication after your surgery it is ok to stop day one. Opioids include: Codeine, Hydrocodone(Norco, Vicodin), Oxycodone(Percocet, oxycontin) and hydromorphone amongst others.  Long term and even short term use of opiods can cause: Increased pain response Dependence Constipation Depression Respiratory depression And more.  Withdrawal symptoms can include Flu like symptoms Nausea, vomiting And more Techniques to manage these symptoms Hydrate well Eat regular healthy meals Stay active Use relaxation techniques(deep breathing, meditating, yoga) Do  Not substitute Alcohol to help with tapering If you have been on opioids for less than two weeks and do not have pain than it is ok to stop all together.  Plan to wean off of opioids This plan should start within one week post op of your joint replacement. Maintain the same interval or time between taking each dose and first decrease the dose.  Cut the total daily intake of opioids by one tablet each day Next start to increase the time between doses. The last dose that should be eliminated is the evening dose.      TED hose   Complete by: As directed    Use stockings (TED hose) for 2 weeks on both  leg(s).  You may remove them at night for sleeping.        Follow-up Information     Durene Romans, MD. Go on 01/27/2023.   Specialty: Orthopedic Surgery Why: You are scheduled for first post op follow up on Wednesday May 8 at 3:00pm. Contact information: [address] [place] [address] [place] [address] [phone]                  Signed: Cassandria Anger 01/19/2023, 7:11 AM

## 2023-01-20 DIAGNOSIS — M25561 Pain in right knee: Secondary | ICD-10-CM | POA: Diagnosis not present

## 2023-01-20 DIAGNOSIS — Z96651 Presence of right artificial knee joint: Secondary | ICD-10-CM | POA: Diagnosis not present

## 2023-01-25 DIAGNOSIS — Z96651 Presence of right artificial knee joint: Secondary | ICD-10-CM | POA: Diagnosis not present

## 2023-01-25 DIAGNOSIS — M25561 Pain in right knee: Secondary | ICD-10-CM | POA: Diagnosis not present

## 2023-01-28 DIAGNOSIS — M25561 Pain in right knee: Secondary | ICD-10-CM | POA: Diagnosis not present

## 2023-01-28 DIAGNOSIS — Z96651 Presence of right artificial knee joint: Secondary | ICD-10-CM | POA: Diagnosis not present

## 2023-02-02 DIAGNOSIS — M25561 Pain in right knee: Secondary | ICD-10-CM | POA: Diagnosis not present

## 2023-02-02 DIAGNOSIS — Z96651 Presence of right artificial knee joint: Secondary | ICD-10-CM | POA: Diagnosis not present

## 2023-02-04 DIAGNOSIS — M25561 Pain in right knee: Secondary | ICD-10-CM | POA: Diagnosis not present

## 2023-02-04 DIAGNOSIS — Z96651 Presence of right artificial knee joint: Secondary | ICD-10-CM | POA: Diagnosis not present

## 2023-02-09 DIAGNOSIS — M25561 Pain in right knee: Secondary | ICD-10-CM | POA: Diagnosis not present

## 2023-02-09 DIAGNOSIS — Z96651 Presence of right artificial knee joint: Secondary | ICD-10-CM | POA: Diagnosis not present

## 2023-02-11 DIAGNOSIS — M25561 Pain in right knee: Secondary | ICD-10-CM | POA: Diagnosis not present

## 2023-02-11 DIAGNOSIS — Z96651 Presence of right artificial knee joint: Secondary | ICD-10-CM | POA: Diagnosis not present

## 2023-02-16 ENCOUNTER — Other Ambulatory Visit: Payer: Self-pay | Admitting: Internal Medicine

## 2023-02-16 DIAGNOSIS — M25561 Pain in right knee: Secondary | ICD-10-CM | POA: Diagnosis not present

## 2023-02-16 DIAGNOSIS — Z96651 Presence of right artificial knee joint: Secondary | ICD-10-CM | POA: Diagnosis not present

## 2023-02-16 DIAGNOSIS — Z1231 Encounter for screening mammogram for malignant neoplasm of breast: Secondary | ICD-10-CM

## 2023-02-18 DIAGNOSIS — M25561 Pain in right knee: Secondary | ICD-10-CM | POA: Diagnosis not present

## 2023-02-18 DIAGNOSIS — Z96651 Presence of right artificial knee joint: Secondary | ICD-10-CM | POA: Diagnosis not present

## 2023-02-19 ENCOUNTER — Ambulatory Visit: Payer: Medicare Other

## 2023-02-23 DIAGNOSIS — Z96651 Presence of right artificial knee joint: Secondary | ICD-10-CM | POA: Diagnosis not present

## 2023-02-23 DIAGNOSIS — M25561 Pain in right knee: Secondary | ICD-10-CM | POA: Diagnosis not present

## 2023-02-25 DIAGNOSIS — M25561 Pain in right knee: Secondary | ICD-10-CM | POA: Diagnosis not present

## 2023-02-25 DIAGNOSIS — Z96651 Presence of right artificial knee joint: Secondary | ICD-10-CM | POA: Diagnosis not present

## 2023-02-26 DIAGNOSIS — Z471 Aftercare following joint replacement surgery: Secondary | ICD-10-CM | POA: Diagnosis not present

## 2023-02-26 DIAGNOSIS — Z96651 Presence of right artificial knee joint: Secondary | ICD-10-CM | POA: Diagnosis not present

## 2023-03-01 ENCOUNTER — Ambulatory Visit
Admission: RE | Admit: 2023-03-01 | Discharge: 2023-03-01 | Disposition: A | Discharge status: Home / Self Care | Payer: Medicare Other | Source: Ambulatory Visit | Attending: Internal Medicine | Admitting: Internal Medicine

## 2023-03-01 DIAGNOSIS — Z1231 Encounter for screening mammogram for malignant neoplasm of breast: Secondary | ICD-10-CM | POA: Diagnosis not present

## 2023-05-31 DIAGNOSIS — I10 Essential (primary) hypertension: Secondary | ICD-10-CM | POA: Diagnosis not present

## 2023-05-31 DIAGNOSIS — H35031 Hypertensive retinopathy, right eye: Secondary | ICD-10-CM | POA: Diagnosis not present

## 2023-05-31 DIAGNOSIS — Z9181 History of falling: Secondary | ICD-10-CM | POA: Diagnosis not present

## 2023-05-31 DIAGNOSIS — M5136 Other intervertebral disc degeneration, lumbar region: Secondary | ICD-10-CM | POA: Diagnosis not present

## 2023-05-31 DIAGNOSIS — E7800 Pure hypercholesterolemia, unspecified: Secondary | ICD-10-CM | POA: Diagnosis not present

## 2023-05-31 DIAGNOSIS — Z0000 Encounter for general adult medical examination without abnormal findings: Secondary | ICD-10-CM | POA: Diagnosis not present

## 2023-05-31 DIAGNOSIS — Z9989 Dependence on other enabling machines and devices: Secondary | ICD-10-CM | POA: Diagnosis not present

## 2023-05-31 DIAGNOSIS — G629 Polyneuropathy, unspecified: Secondary | ICD-10-CM | POA: Diagnosis not present

## 2023-05-31 DIAGNOSIS — G4733 Obstructive sleep apnea (adult) (pediatric): Secondary | ICD-10-CM | POA: Diagnosis not present

## 2023-05-31 DIAGNOSIS — M1711 Unilateral primary osteoarthritis, right knee: Secondary | ICD-10-CM | POA: Diagnosis not present

## 2023-09-16 DIAGNOSIS — H524 Presbyopia: Secondary | ICD-10-CM | POA: Diagnosis not present

## 2023-09-16 DIAGNOSIS — Z961 Presence of intraocular lens: Secondary | ICD-10-CM | POA: Diagnosis not present

## 2023-09-16 DIAGNOSIS — H0288A Meibomian gland dysfunction right eye, upper and lower eyelids: Secondary | ICD-10-CM | POA: Diagnosis not present

## 2023-09-16 DIAGNOSIS — H2511 Age-related nuclear cataract, right eye: Secondary | ICD-10-CM | POA: Diagnosis not present

## 2023-09-16 DIAGNOSIS — H04123 Dry eye syndrome of bilateral lacrimal glands: Secondary | ICD-10-CM | POA: Diagnosis not present

## 2023-09-16 DIAGNOSIS — H5213 Myopia, bilateral: Secondary | ICD-10-CM | POA: Diagnosis not present

## 2023-09-16 DIAGNOSIS — H0288B Meibomian gland dysfunction left eye, upper and lower eyelids: Secondary | ICD-10-CM | POA: Diagnosis not present

## 2023-09-16 DIAGNOSIS — H52203 Unspecified astigmatism, bilateral: Secondary | ICD-10-CM | POA: Diagnosis not present

## 2023-11-26 ENCOUNTER — Emergency Department (HOSPITAL_COMMUNITY)
Admission: EM | Admit: 2023-11-26 | Discharge: 2023-11-26 | Disposition: A | Discharge status: Home / Self Care | Attending: Emergency Medicine | Admitting: Emergency Medicine

## 2023-11-26 ENCOUNTER — Encounter (HOSPITAL_COMMUNITY): Payer: Self-pay

## 2023-11-26 ENCOUNTER — Other Ambulatory Visit: Payer: Self-pay

## 2023-11-26 ENCOUNTER — Emergency Department (HOSPITAL_COMMUNITY)

## 2023-11-26 DIAGNOSIS — R519 Headache, unspecified: Secondary | ICD-10-CM | POA: Insufficient documentation

## 2023-11-26 DIAGNOSIS — R0789 Other chest pain: Secondary | ICD-10-CM | POA: Diagnosis not present

## 2023-11-26 DIAGNOSIS — R079 Chest pain, unspecified: Secondary | ICD-10-CM | POA: Diagnosis not present

## 2023-11-26 DIAGNOSIS — I10 Essential (primary) hypertension: Secondary | ICD-10-CM | POA: Diagnosis not present

## 2023-11-26 DIAGNOSIS — Z5321 Procedure and treatment not carried out due to patient leaving prior to being seen by health care provider: Secondary | ICD-10-CM | POA: Insufficient documentation

## 2023-11-26 DIAGNOSIS — G4733 Obstructive sleep apnea (adult) (pediatric): Secondary | ICD-10-CM | POA: Diagnosis not present

## 2023-11-26 DIAGNOSIS — E7800 Pure hypercholesterolemia, unspecified: Secondary | ICD-10-CM | POA: Diagnosis not present

## 2023-11-26 DIAGNOSIS — R030 Elevated blood-pressure reading, without diagnosis of hypertension: Secondary | ICD-10-CM | POA: Diagnosis not present

## 2023-11-26 DIAGNOSIS — M1711 Unilateral primary osteoarthritis, right knee: Secondary | ICD-10-CM | POA: Diagnosis not present

## 2023-11-26 DIAGNOSIS — H35031 Hypertensive retinopathy, right eye: Secondary | ICD-10-CM | POA: Diagnosis not present

## 2023-11-26 LAB — BASIC METABOLIC PANEL
Anion gap: 10 (ref 5–15)
BUN: 15 mg/dL (ref 8–23)
CO2: 26 mmol/L (ref 22–32)
Calcium: 8.6 mg/dL — ABNORMAL LOW (ref 8.9–10.3)
Chloride: 108 mmol/L (ref 98–111)
Creatinine, Ser: 0.87 mg/dL (ref 0.44–1.00)
GFR, Estimated: >60 mL/min (ref >60)
Glucose, Bld: 106 mg/dL — ABNORMAL HIGH (ref 70–99)
Potassium: 3.5 mmol/L (ref 3.5–5.1)
Sodium: 144 mmol/L (ref 135–145)

## 2023-11-26 LAB — TROPONIN I (HIGH SENSITIVITY): Troponin I (High Sensitivity): 5 ng/L (ref <18)

## 2023-11-26 LAB — CBC
HCT: 40 % (ref 36.0–46.0)
Hemoglobin: 13.1 g/dL (ref 12.0–15.0)
MCH: 30.5 pg (ref 26.0–34.0)
MCHC: 32.8 g/dL (ref 30.0–36.0)
MCV: 93 fL (ref 80.0–100.0)
Platelets: 270 10*3/uL (ref 150–400)
RBC: 4.3 MIL/uL (ref 3.87–5.11)
RDW: 13.5 % (ref 11.5–15.5)
WBC: 5.1 10*3/uL (ref 4.0–10.5)
nRBC: 0 % (ref 0.0–0.2)

## 2023-11-26 MED ORDER — NITROGLYCERIN 0.4 MG SL SUBL
0.4000 mg | SUBLINGUAL_TABLET | SUBLINGUAL | Status: DC | PRN
  Filled 2023-11-26: qty 1

## 2023-11-26 MED ORDER — NITROGLYCERIN 0.4 MG SL SUBL: 0.4000 mg | SUBLINGUAL_TABLET | SUBLINGUAL | 0 refills | Status: AC | PRN

## 2023-11-26 NOTE — ED Triage Notes (Addendum)
 Pt here for left sided chest pain and left arm pain that started a week ago. Denies SHOB. Pt took 81mg  of aspirin

## 2023-11-26 NOTE — ED Provider Notes (Signed)
 [place] EMERGENCY DEPARTMENT AT [facility] Provider Note   CSN: [id number] Arrival date & time: 11/26/23  1212     History  Chief Complaint  Patient presents with   Chest Pain    Valerie Chung is a 73 y.o. female with a history of high cholesterol, high blood pressure, presented to ED with chest pain and arm soreness.  Patient reports she has had these symptoms for several years.  She has been seen by cardiologist about 5 to 6 years ago at [facility] for that issue and had a stress test which was unremarkable.  She says she has been living with this on and off for several years.  She feels that she has had more episodes than normal this past week.  The episodes can occur at random, not typically with exertion.  She is also had a headache which is typical with the symptoms.  She went to see her PCP today and there was concern that she may have potential T wave inversions on her EKG and she was referred into the ED for further evaluation.  She denies to me any active chest pressure but reports some mild to moderate soreness in her left arm and a mild to moderate headache.  She did take a baby aspirin today.   HPI     Home Medications Prior to Admission medications   Medication Sig Start Date End Date Taking? Authorizing Provider  nitroGLYCERIN (NITROSTAT) 0.4 MG SL tablet Place 1 tablet (0.4 mg total) under the tongue every 5 (five) minutes as needed for chest pain. 11/26/23  Yes Terald Sleeper, MD  atorvastatin (LIPITOR) 40 MG tablet Take 40 mg by mouth daily. 02/27/20   [provider]  celecoxib (CELEBREX) 200 MG capsule Take 1 capsule (200 mg total) by mouth 2 (two) times daily. 01/13/23   Cassandria Anger, PA-C  methocarbamol (ROBAXIN) 500 MG tablet Take 1 tablet (500 mg total) by mouth every 6 (six) hours as needed for muscle spasms. 01/13/23   Cassandria Anger, PA-C  metoprolol succinate (TOPROL-XL) 25 MG 24 hr tablet Take 25 mg by mouth daily.  06/16/19   [provider]  oxyCODONE (OXY IR/ROXICODONE) 5 MG immediate release tablet Take 1 tablet (5 mg total) by mouth every 4 (four) hours as needed for severe pain. 01/13/23   Cassandria Anger, PA-C  polyethylene glycol (MIRALAX / GLYCOLAX) 17 g packet Take 17 g by mouth 2 (two) times daily. 01/13/23   Cassandria Anger, PA-C      Allergies    Tramadol    Review of Systems   Review of Systems  Physical Exam Updated Vital Signs BP 136/75   Pulse (!) 52   Temp 98.4 F (36.9 C) (Oral)   Resp 18   Ht 5\' 9"  (1.753 m)   Wt 113.9 kg   SpO2 99%   BMI 37.07 kg/m  Physical Exam Constitutional:      General: She is not in acute distress. HENT:     Head: Normocephalic and atraumatic.  Eyes:     Conjunctiva/sclera: Conjunctivae normal.     Pupils: Pupils are equal, round, and reactive to light.  Cardiovascular:     Rate and Rhythm: Normal rate and regular rhythm.  Pulmonary:     Effort: Pulmonary effort is normal. No respiratory distress.  Abdominal:     General: There is no distension.     Tenderness: There is no abdominal tenderness.  Skin:  General: Skin is warm and dry.  Neurological:     General: No focal deficit present.     Mental Status: She is alert. Mental status is at baseline.  Psychiatric:        Mood and Affect: Mood normal.        Behavior: Behavior normal.     ED Results / Procedures / Treatments   Labs (all labs ordered are listed, but only abnormal results are displayed) Labs Reviewed  BASIC METABOLIC PANEL - Abnormal; Notable for the following components:      Result Value   Glucose, Bld 106 (*)    Calcium 8.6 (*)    All other components within normal limits  CBC  TROPONIN I (HIGH SENSITIVITY)  TROPONIN I (HIGH SENSITIVITY)    EKG EKG Interpretation Date/Time:  Friday November 26 2023 12:22:01 EST Ventricular Rate:  61 PR Interval:  142 QRS Duration:  76 QT Interval:  422 QTC Calculation: 424 R Axis:   -25  Text  Interpretation: Normal sinus rhythm When compared with ECG of 07-Jan-2023 10:21, PREVIOUS ECG IS PRESENT NO sig change from prior tracing, chronic T wave inversions inferior/lateral leads unchanged Confirmed by Alvester Chou [phone]) on 11/26/2023 2:39:12 PM  Radiology DG Chest 2 View Result Date: 11/26/2023 CLINICAL DATA:  Chest pain. EXAM: CHEST - 2 VIEW COMPARISON:  None Available. FINDINGS: The cardiomediastinal contours are normal. The lungs are clear. Pulmonary vasculature is normal. No consolidation, pleural effusion, or pneumothorax. No acute osseous abnormalities are seen. Mild thoracic spondylosis. IMPRESSION: No active cardiopulmonary disease. Electronically Signed   By: Narda Rutherford M.D.   On: 11/26/2023 16:06    Procedures Procedures    Medications Ordered in ED Medications - No data to display  ED Course/ Medical Decision Making/ A&P Clinical Course as of 11/27/23 1241  Fri Nov 26, 2023  1621 Patient reports some minor relief with the nitroglycerin and feels that her soreness is gone away.  She continues to have a persistent headache but no chest pain.  At this point I think it is reasonable for her to follow-up as an outpatient we will place a referral as she prefers to see a more local provider for cardiology.  She has not had any concerning ischemic changes on telemetry or EKG to warrant immediate hospitalization.  Initial troponin was unremarkable with multiple days of symptoms, she has not had any acute chest pain episodes to need repeat troponin trending at this time. [MT]    Clinical Course User Index [MT] Monserrat Vidaurri, Kermit Balo, MD                                 Medical Decision Making Amount and/or Complexity of Data Reviewed Labs: ordered. Radiology: ordered.  Risk Prescription drug management.   This patient presents to the Emergency Department with complaint of chest pain. This involves an extensive number of treatment options, and is a complaint that carries  with it a high risk of complications and morbidity, given the patient's comorbidity, including HTN .The differential diagnosis includes ACS vs Pneumothorax vs Reflux/Gastritis vs MSK pain vs Pneumonia vs other.  I felt PE was less likely given that she has no immediate risk factors, no hypoxia or tachycardia, and intermittent symptoms  I ordered, reviewed, and interpreted labs.  Pertinent results include troponin negative, labs unremarkable I ordered medication nitroglycerin for chest pain I ordered imaging studies which included dg chest I independently  visualized and interpreted imaging which showed no emergent findings, and the monitor tracing which showed sinus bradycardia . I agree with the radiologist interpretation  External records obtained and reviewed showing 2019 [facility] cardiology evaluations - unremarkable stress test - she is noted to have T wave inversions on her ecg at that time as well I personally reviewed the patients ECG which showed sinus rhythm /mild sinus bradycardia with no acute ischemic findings (T wave inversions appear chronic)  After the interventions stated above, I reevaluated the patient and found that they were clinically improved and well appearing.  Based on the patient's clinical exam, vital signs, risk factors, and ED testing, I felt that the patient's overall risk of life-threatening emergency such as ACS, PE, sepsis, or infection was low.  At this time, I felt the patient's presentation was most clinically consistent with nonspecific chest pain, but explained to the patient that this evaluation was not a definitive diagnostic workup.  I discussed outpatient follow up with primary care provider, and provided specialist office number on the patient's discharge paper if a referral was deemed necessary.  Return precautions were discussed with the patient.  I felt the patient was clinically stable for discharge.   Moderate HEART score - patient understands need for  close outpatient cardiology follow up.  Husband also present for our discussion.        Final Clinical Impression(s) / ED Diagnoses Final diagnoses:  Chest pain, unspecified type    Rx / DC Orders ED Discharge Orders          Ordered    nitroGLYCERIN (NITROSTAT) 0.4 MG SL tablet  Every 5 min PRN        11/26/23 1620    Ambulatory referral to Cardiology       Comments: Chest pain evaluation - pt seen >6 years ago at [facility] but requesting transfer to local specialist in [facility]   11/26/23 1620              Terald Sleeper, MD 11/27/23 1244

## 2023-12-02 ENCOUNTER — Encounter: Payer: Self-pay | Admitting: Internal Medicine

## 2024-01-15 IMAGING — MG DIGITAL DIAGNOSTIC BILAT W/ TOMO W/ CAD
6 of 10 series · 6 of 30 positions shown · non-contrast
Comparison: Previous exam(s).

ACR Breast Density Category a: The breast tissue is almost entirely
fatty.

CLINICAL DATA: 70-year-old female presenting for nonfocal breast
pain throughout the lateral aspect of the left breast.

EXAM:
DIGITAL DIAGNOSTIC BILATERAL MAMMOGRAM WITH TOMOSYNTHESIS AND CAD
TECHNIQUE: Bilateral digital diagnostic mammography and breast tomosynthesis
was performed. The images were evaluated with computer-aided
detection.

[R MLO synth-2D (1 of 2)]
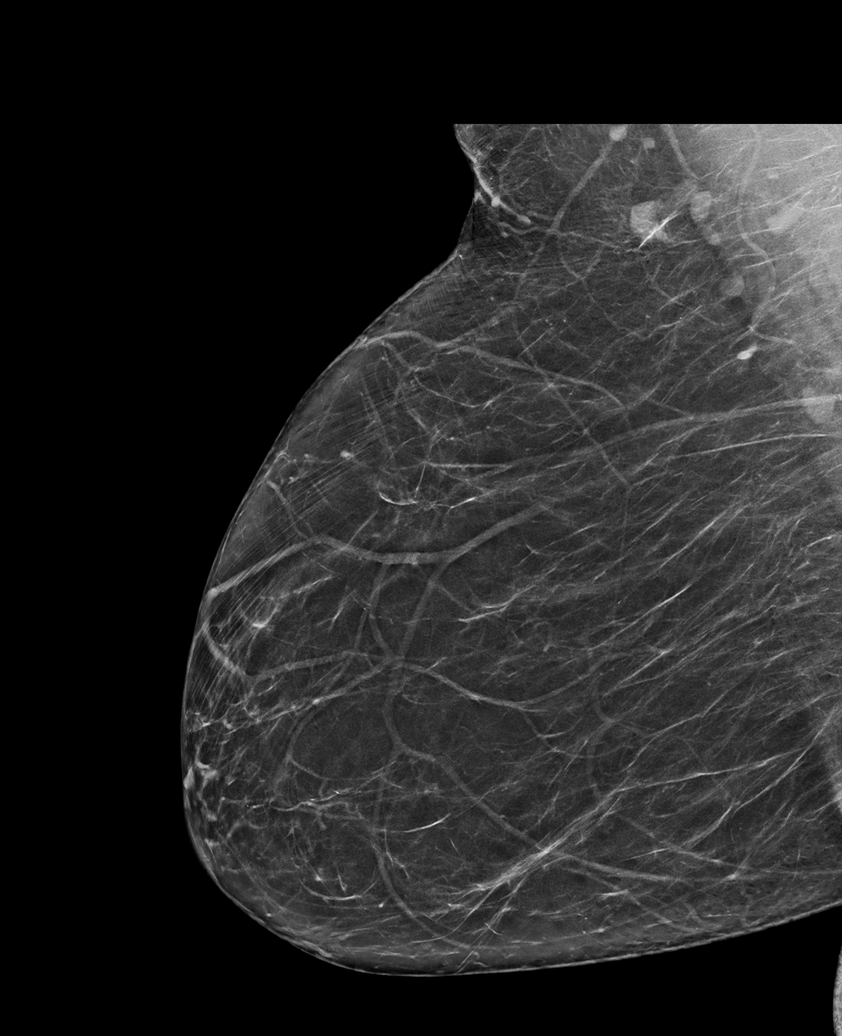

[L MLO synth-2D]
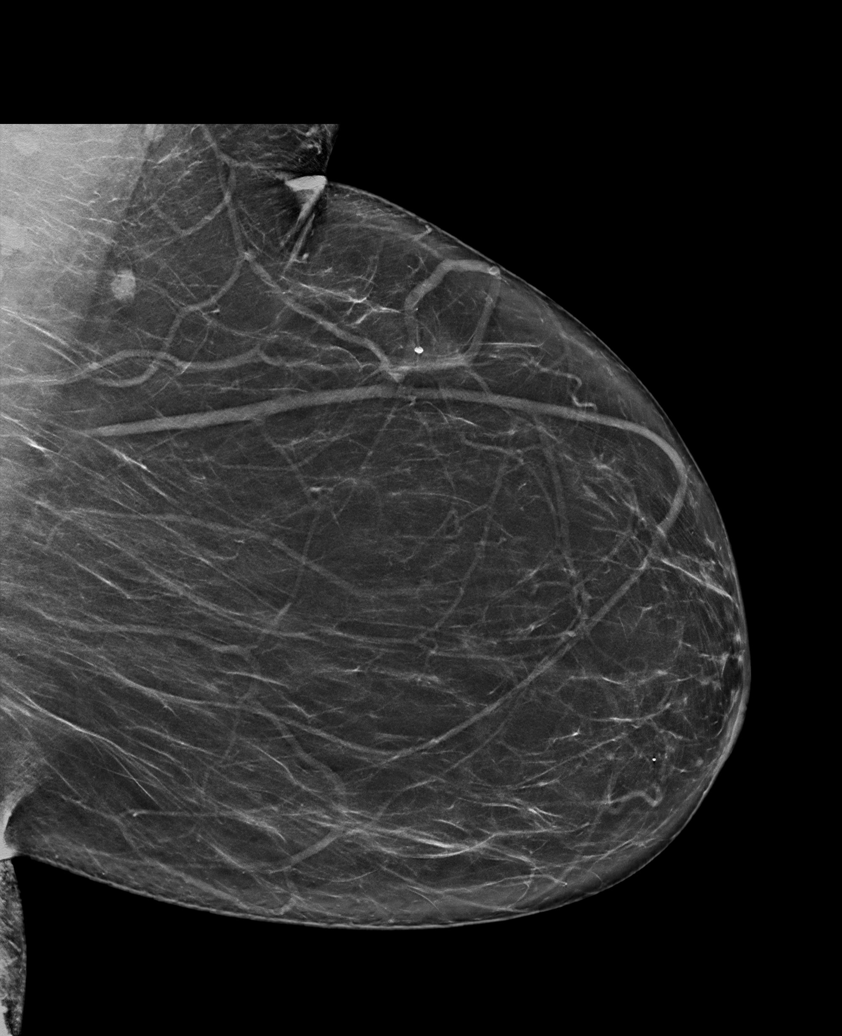

[R CC synth-2D]
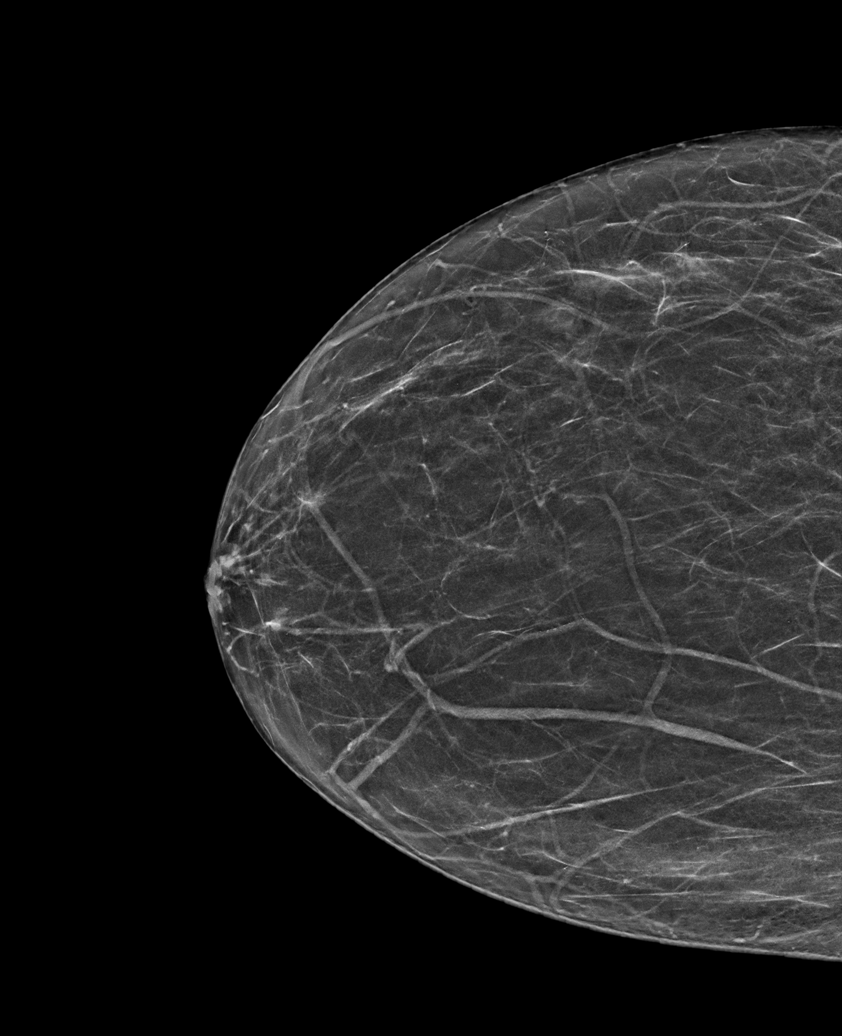

[L CC synth-2D]
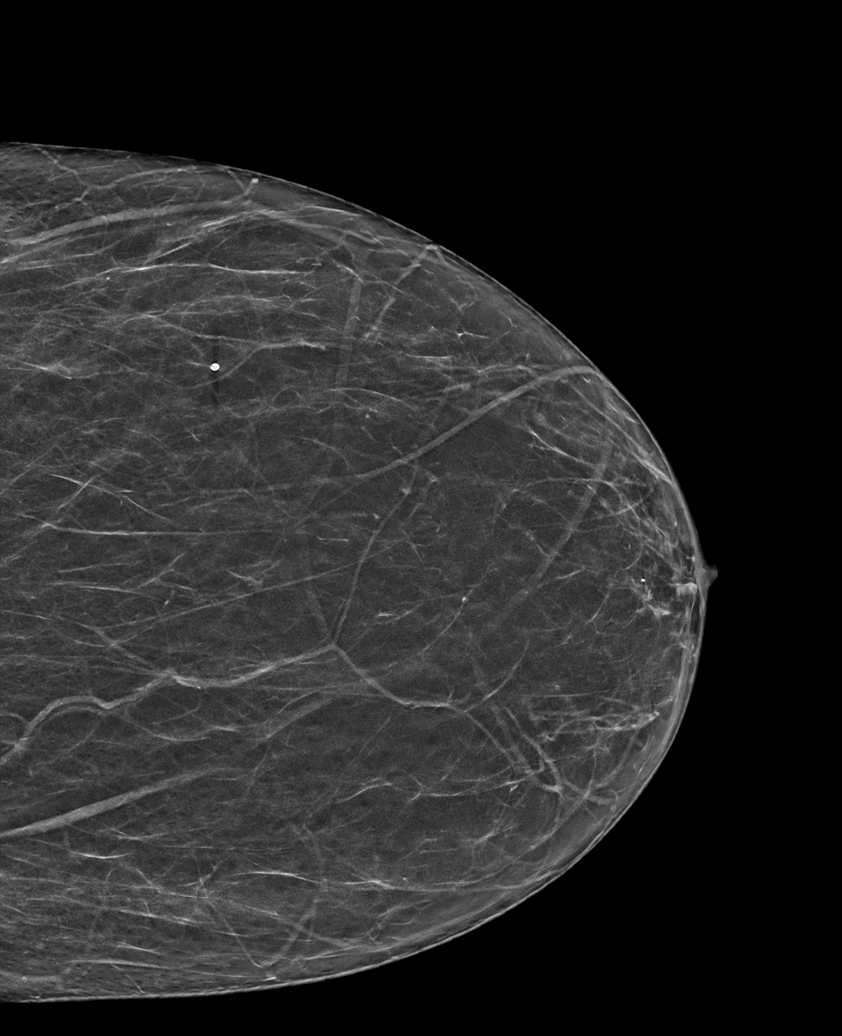

[R MLO synth-2D (2 of 2)]
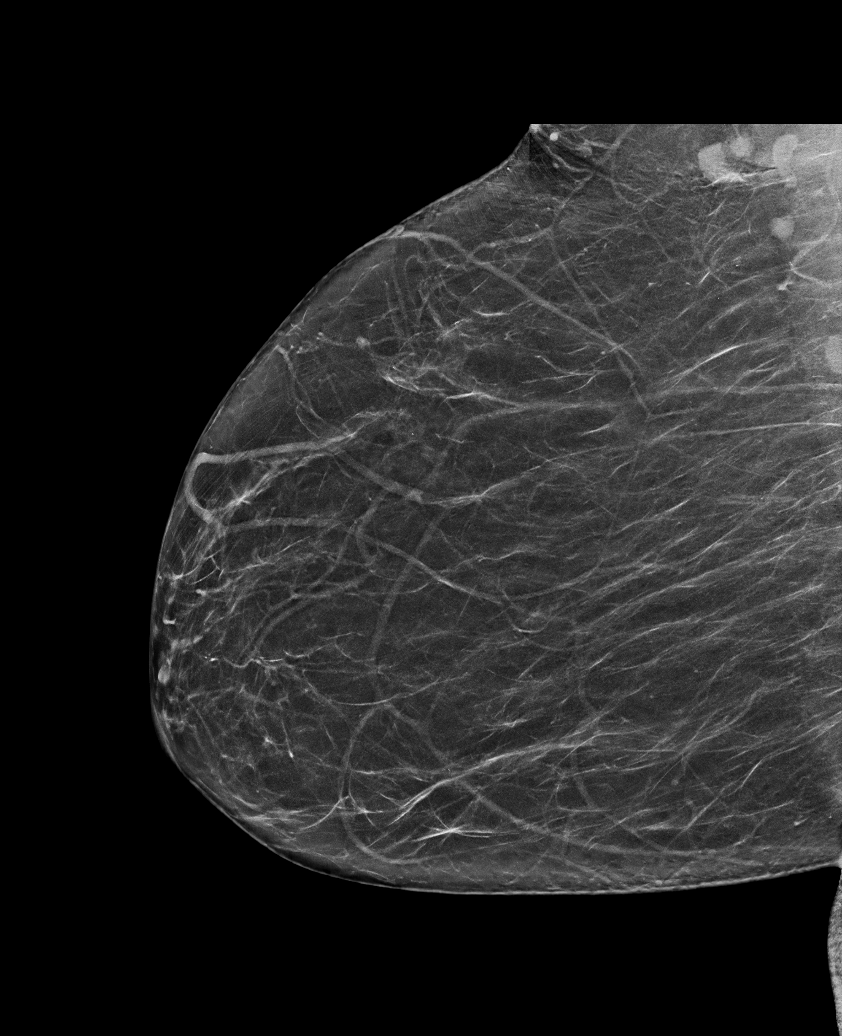

[L MLO tomo · tomo slice 40/79.0]
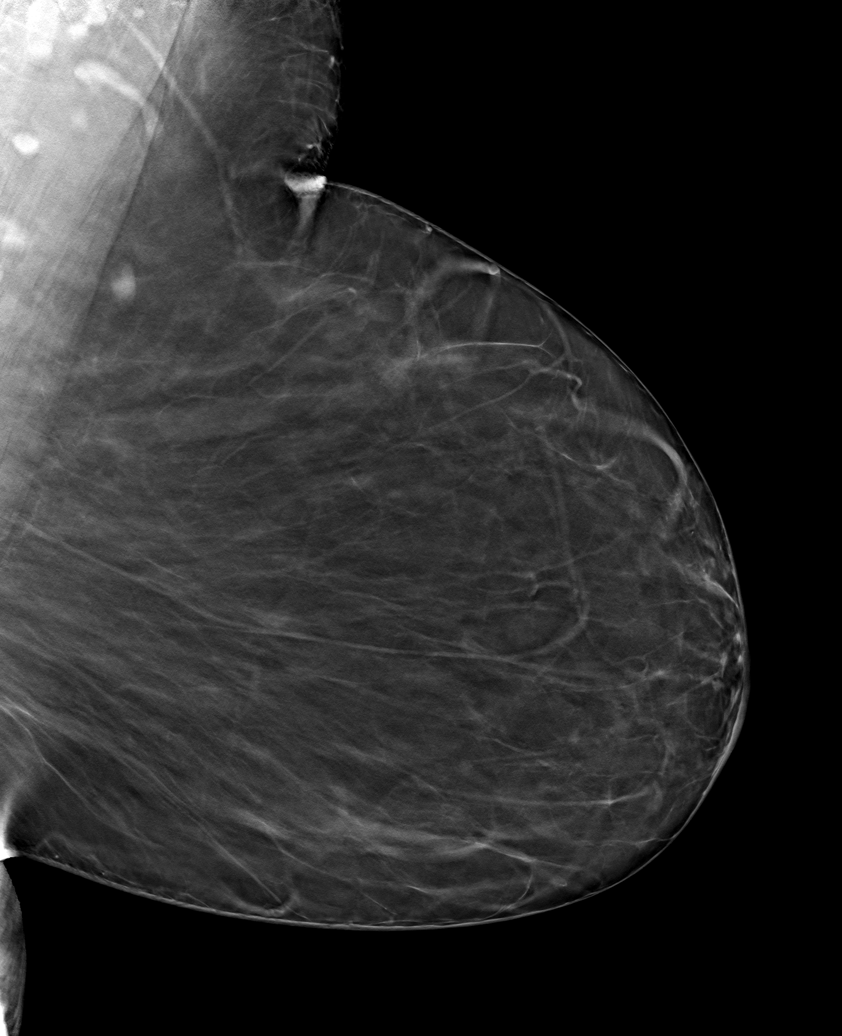

[6 of 30 positions shown; findings below may reference images not displayed]

FINDINGS: No suspicious calcifications, masses or areas of distortion are seen
in the bilateral breasts.
IMPRESSION: 1. No suspicious findings in the left breast to correspond with the
patient's nonfocal breast pain.

2. No suspicious calcifications, masses or areas of distortion are
seen in the bilateral breasts.

RECOMMENDATION:
1. Clinical follow-up recommended for the left breast pain. Any
further workup should be based on clinical grounds.

2.  Screening mammogram in one year.(Code:[id number])

I have discussed the findings and recommendations with the patient.
If applicable, a reminder letter will be sent to the patient
regarding the next appointment.

BI-RADS CATEGORY  1: Negative.

## 2024-01-21 ENCOUNTER — Ambulatory Visit: Admitting: Internal Medicine

## 2024-01-29 NOTE — Progress Notes (Unsigned)
 Cardiology Office Note:    Date:  02/02/2024   ID:  Valerie Chung, DOB 1951/02/07, MRN [id number]  PCP:  Elester Grim, MD   [facility] Providers Cardiologist:  None     Referring MD: Arvilla Birmingham, MD   Chief Complaint  Patient presents with   Chest Pain    History of Present Illness:    Valerie Chung is a 73 y.o. female is seen at the request of Dr Gordon Latus for evaluation of chest pain. She has a history of HTN, HLD, and sleep apnea- not on CPAP. She had evaluation in 2009 with normal chest CT, Echo and LE venous doppler. Myoview at that time showed possible apical ischemia vs artifact. She has had prior stress tests at [facility]- last a Myoview study in 2019 which was normal.   She reports that for the past 5 months she has noted increase in chest pain. It is left parasternal toward her left breast. Usually dull but sometimes is quite sharp and painful. Not clearly exertional. Has taken Ntg with some but not complete relief.   Past Medical History:  Diagnosis Date   Arthritis    Back pain    low   Complication of anesthesia    slow to wake up   Hyperlipemia    Hypertension    Sleep apnea     Past Surgical History:  Procedure Laterality Date   BACK SURGERY     BREAST EXCISIONAL BIOPSY Right    TOTAL KNEE ARTHROPLASTY Right 01/12/2023   Procedure: TOTAL KNEE ARTHROPLASTY;  Surgeon: Claiborne Crew, MD;  Location: [facility];  Service: Orthopedics;  Laterality: Right;    Current Medications: Current Meds  Medication Sig   aspirin  81 MG chewable tablet Chew 81 mg by mouth daily.   atorvastatin  (LIPITOR) 40 MG tablet Take 40 mg by mouth daily.   metoprolol  succinate (TOPROL -XL) 25 MG 24 hr tablet Take 25 mg by mouth daily.   metoprolol  tartrate (LOPRESSOR ) 100 MG tablet Take 100 mg  2 hours before Coronary CT   nitroGLYCERIN  (NITROSTAT ) 0.4 MG SL tablet Place 1 tablet (0.4 mg total) under the tongue every 5 (five) minutes as needed for chest pain.    oxyCODONE  (OXY IR/ROXICODONE ) 5 MG immediate release tablet Take 1 tablet (5 mg total) by mouth every 4 (four) hours as needed for severe pain.   polyethylene glycol (MIRALAX  / GLYCOLAX ) 17 g packet Take 17 g by mouth 2 (two) times daily.     Allergies:   Tramadol  and Pneumococcal 13-val conj vacc   Social History   Socioeconomic History   Marital status: Married    Spouse name: Not on file   Number of children: 4   Years of education: Not on file   Highest education level: Not on file  Occupational History   Not on file  Tobacco Use   Smoking status: Former   Smokeless tobacco: Never  Vaping Use   Vaping status: Never Used  Substance and Sexual Activity   Alcohol use: Yes    Comment: occasional   Drug use: Never   Sexual activity: Not on file  Other Topics Concern   Not on file  Social History Narrative   Not on file   Social Drivers of Health   Financial Resource Strain: Not on file  Food Insecurity: No Food Insecurity (01/12/2023)   Hunger Vital Sign    Worried About Running Out of Food in the Last Year: Never true  Ran Out of Food in the Last Year: Never true  Transportation Needs: No Transportation Needs (01/12/2023)   PRAPARE - Administrator, Civil Service (Medical): No    Lack of Transportation (Non-Medical): No  Physical Activity: Not on file  Stress: Not on file  Social Connections: Not on file     Family History: The patient's family history includes Cancer in her mother; Cerebral aneurysm in her sister; Diabetes Mellitus II in her sister; Heart failure in her mother.  ROS:   Please see the history of present illness.     All other systems reviewed and are negative.  EKGs/Labs/Other Studies Reviewed:    The following studies were reviewed today: EKG Interpretation Date/Time:  Wednesday Feb 02 2024 08:47:20 EDT Ventricular Rate:  58 PR Interval:  146 QRS Duration:  84 QT Interval:  426 QTC Calculation: 418 R Axis:   -24  Text  Interpretation: Sinus bradycardia with sinus arrhythmia Left ventricular hypertrophy with repolarization abnormality ( R in aVL ) When compared with ECG of 26-Nov-2023 12:22, Nonspecific T wave abnormality has replaced inverted T waves in Inferior leads Confirmed by [place], Chardonay [place] [phone]) on 02/02/2024 8:52:10 AM    EKG Interpretation Date/Time:  Wednesday Feb 02 2024 08:47:20 EDT Ventricular Rate:  58 PR Interval:  146 QRS Duration:  84 QT Interval:  426 QTC Calculation: 418 R Axis:   -24  Text Interpretation: Sinus bradycardia with sinus arrhythmia Left ventricular hypertrophy with repolarization abnormality ( R in aVL ) When compared with ECG of 26-Nov-2023 12:22, Nonspecific T wave abnormality has replaced inverted T waves in Inferior leads Confirmed by [place], Arda Keadle [phone]) on 02/02/2024 8:52:10 AM    Recent Labs: 11/26/2023: BUN 15; Creatinine, Ser 0.87; Hemoglobin 13.1; Platelets 270; Potassium 3.5; Sodium 144  Recent Lipid Panel No results found for: "CHOL", "TRIG", "HDL", "CHOLHDL", "VLDL", "LDLCALC", "LDLDIRECT"  Dated 05/31/23: cholesterol 139, triglycerides 50, HDL 65, LDL 63.  Risk Assessment/Calculations:                Physical Exam:    VS:  BP 120/88 (Cuff Size: Large)   Pulse 86   Ht 5\' 9"  (1.753 m)   Wt 251 lb (113.9 kg)   SpO2 96%   BMI 37.07 kg/m     Wt Readings from Last 3 Encounters:  02/02/24 251 lb (113.9 kg)  11/26/23 251 lb (113.9 kg)  01/12/23 250 lb (113.4 kg)     GEN:  Well nourished, well developed in no acute distress HEENT: Normal NECK: No JVD; No carotid bruits LYMPHATICS: No lymphadenopathy CARDIAC: RRR, no murmurs, rubs, gallops, minimal chest wall tenderness to palpation.  RESPIRATORY:  Clear to auscultation without rales, wheezing or rhonchi  ABDOMEN: Soft, non-tender, non-distended MUSCULOSKELETAL:  No edema; No deformity  SKIN: Warm and dry NEUROLOGIC:  Alert and oriented x 3 PSYCHIATRIC:  Normal affect   ASSESSMENT:    1.  Coronary artery disease involving native coronary artery of native heart with angina pectoris (HCC)    PLAN:    In order of problems listed above:  Chest pain. Symptoms concerning for angina. Risk factors of HTN and HLD. Prior evaluation in the past with nuclear stress tests were OK. Recommend coronary CTA to assess. Continue ASA, metoprolol , statin. Depending on results of CT may increase antianginal therapy or consider cardiac cath. If CT negative consider other causes of chest pain HTN. Controlled HLD on statin. LDL 63.  Medication Adjustments/Labs and Tests Ordered: Current medicines are reviewed at length with the patient today.  Concerns regarding medicines are outlined above.  Orders Placed This Encounter  Procedures   EKG 12-Lead   Meds ordered this encounter  Medications   metoprolol  tartrate (LOPRESSOR ) 100 MG tablet    Sig: Take 100 mg  2 hours before Coronary CT    Dispense:  1 tablet    Refill:  0    There are no Patient Instructions on file for this visit.   Signed, Noreen Mackintosh [place], MD  02/02/2024 9:11 AM    [facility]

## 2024-02-01 ENCOUNTER — Other Ambulatory Visit: Payer: Self-pay | Admitting: Internal Medicine

## 2024-02-01 DIAGNOSIS — Z1231 Encounter for screening mammogram for malignant neoplasm of breast: Secondary | ICD-10-CM

## 2024-02-02 ENCOUNTER — Ambulatory Visit: Attending: Cardiology | Admitting: Cardiology

## 2024-02-02 ENCOUNTER — Encounter: Payer: Self-pay | Admitting: Cardiology

## 2024-02-02 VITALS — BP 120/88 | HR 86 | Ht 69.0 in | Wt 251.0 lb

## 2024-02-02 DIAGNOSIS — E7800 Pure hypercholesterolemia, unspecified: Secondary | ICD-10-CM | POA: Diagnosis not present

## 2024-02-02 DIAGNOSIS — I10 Essential (primary) hypertension: Secondary | ICD-10-CM

## 2024-02-02 DIAGNOSIS — I25119 Atherosclerotic heart disease of native coronary artery with unspecified angina pectoris: Secondary | ICD-10-CM | POA: Diagnosis not present

## 2024-02-02 DIAGNOSIS — R072 Precordial pain: Secondary | ICD-10-CM | POA: Diagnosis not present

## 2024-02-02 LAB — BASIC METABOLIC PANEL WITH GFR
BUN/Creatinine Ratio: 13 (ref 12–28)
BUN: 11 mg/dL (ref 8–27)
CO2: 23 mmol/L (ref 20–29)
Calcium: 8.9 mg/dL (ref 8.7–10.3)
Chloride: 102 mmol/L (ref 96–106)
Creatinine, Ser: 0.85 mg/dL (ref 0.57–1.00)
Glucose: 110 mg/dL — ABNORMAL HIGH (ref 70–99)
Potassium: 3.4 mmol/L — ABNORMAL LOW (ref 3.5–5.2)
Sodium: 142 mmol/L (ref 134–144)
eGFR: 72 mL/min/{1.73_m2} (ref >59)

## 2024-02-02 MED ORDER — METOPROLOL TARTRATE 100 MG PO TABS: ORAL_TABLET | ORAL | 0 refills | Status: AC

## 2024-02-02 NOTE — Patient Instructions (Addendum)
 Medication Instructions:  Continue same medications  Lab Work: Bmet today  Testing/Procedures: Coronary CT will be scheduled after approved by insurance   Follow instructions below  Follow-Up: At [facility], you and your health needs are our priority.  As part of our continuing mission to provide you with exceptional heart care, our providers are all part of one team.  This team includes your primary Cardiologist (physician) and Advanced Practice Providers or APPs (Physician Assistants and Nurse Practitioners) who all work together to provide you with the care you need, when you need it.  Your next appointment:  After test     Tues 6/24 at 11:40 am    Provider:  Dr.Jordan   We recommend signing up for the patient portal called "MyChart".  Sign up information is provided on this After Visit Summary.  MyChart is used to connect with patients for Virtual Visits (Telemedicine).  Patients are able to view lab/test results, encounter notes, upcoming appointments, etc.  Non-urgent messages can be sent to your provider as well.   To learn more about what you can do with MyChart, go to [email or web address].        Your cardiac CT will be scheduled at one of the below locations:   [facility] [address] [place], [place] [address] [phone]  OR  [facility] [address] [place], [place] [address] [phone]  OR   [facility] [address] [place], [place] [address] [phone]  OR   [facility] [address] [place], [place] [address] [phone]  OR   [facility] V[facility] Tower [address]  [place], [place] [address] Opening January 17, 2024  If scheduled at [facility], please arrive at the [facility] and Children's Entrance (Entrance C2) of [facility] 30 minutes prior to test start time. You can use the FREE  valet parking offered at entrance C (encouraged to control the heart rate for the test)  Proceed to the [facility] Radiology Department (first floor) to check-in and test prep.   All radiology patients and guests should use entrance C2 at [facility], accessed from [facility], even though the hospital's physical address listed is [address].    If scheduled at the Heart and Vascular Tower at [address], please enter the parking lot using the [address] entrance and use the FREE valet service at the patient drop-off area. Enter the buidling and check-in with registration on the main floor.  If scheduled at [facility] or [facility], please arrive 15 mins early for check-in and test prep.  There is spacious parking and easy access to the radiology department from the [facility] V[facility] entrance. Please enter here and check-in with the desk attendant.   If scheduled at [facility], please arrive 30 minutes early for check-in and test prep.  Please follow these instructions carefully (unless otherwise directed):  An IV will be required for this test and Nitroglycerin  will be given.    On the Night Before the Test: Be sure to Drink plenty of water . Do not consume any caffeinated/decaffeinated beverages or chocolate 12 hours prior to your test. Do not take any antihistamines 12 hours prior to your test.   On the Day of the Test: Drink plenty of water  until 1 hour prior to the test. Do not eat any food  1 hour prior to test. You may take your regular medications prior to the test.  Take metoprolol  100 mg two hours prior to test. Hold Toprol  XL morning of test FEMALES- please wear underwire-free bra if available, avoid dresses & tight clothing        After the Test: Drink plenty of water . After receiving IV contrast, you may experience a mild flushed feeling. This is normal. On  occasion, you may experience a mild rash up to 24 hours after the test. This is not dangerous. If this occurs, you can take Benadryl  25 mg, Zyrtec, Claritin, or Allegra and increase your fluid intake. (Patients taking Tikosyn should avoid Benadryl , and may take Zyrtec, Claritin, or Allegra) If you experience trouble breathing, this can be serious. If it is severe call 911 IMMEDIATELY. If it is mild, please call our office.  We will call to schedule your test 2-4 weeks out understanding that some insurance companies will need an authorization prior to the service being performed.   For more information and frequently asked questions, please visit our website : [email or web address]  For non-scheduling related questions, please contact the cardiac imaging nurse navigator should you have any questions/concerns: Cardiac Imaging Nurse Navigators Direct Office Dial: [phone]   For scheduling needs, including cancellations and rescheduling, please call [place], [phone].

## 2024-02-03 ENCOUNTER — Ambulatory Visit: Payer: Self-pay | Admitting: Cardiology

## 2024-02-04 ENCOUNTER — Telehealth: Payer: Self-pay | Admitting: Cardiology

## 2024-02-04 NOTE — Telephone Encounter (Signed)
 Patient is returning phone call in regard to lab results. Please advise.

## 2024-02-04 NOTE — Telephone Encounter (Signed)
 Spoke to patient lab results given.Advised to eat more potassium enriched foods.

## 2024-02-16 ENCOUNTER — Telehealth: Payer: Self-pay | Admitting: Cardiology

## 2024-02-16 NOTE — Telephone Encounter (Signed)
 Pt requesting a c/b from the nurse in regards to appt on 6/03.

## 2024-02-16 NOTE — Telephone Encounter (Signed)
 Spoke to patient she was calling to go over cardiac ct instructions.Instructions reviewed she will hold Metoprolol  Succ 25 mg morning of ct and take Metoprolol  Tart 100 mg 2 hours before ct.She voiced understanding.

## 2024-02-21 ENCOUNTER — Telehealth (HOSPITAL_COMMUNITY): Payer: Self-pay | Admitting: *Deleted

## 2024-02-21 NOTE — Telephone Encounter (Signed)
 Attempted to call patient regarding upcoming cardiac CT appointment. Left message on voicemail with name and callback number  Larey Brick RN Navigator Cardiac Imaging [facility] [phone] Office [phone] Cell

## 2024-02-22 ENCOUNTER — Ambulatory Visit (HOSPITAL_COMMUNITY)
Admission: RE | Admit: 2024-02-22 | Discharge: 2024-02-22 | Disposition: A | Discharge status: Home / Self Care | Source: Ambulatory Visit | Attending: Cardiology | Admitting: Cardiology

## 2024-02-22 DIAGNOSIS — R072 Precordial pain: Secondary | ICD-10-CM

## 2024-02-22 MED ORDER — NITROGLYCERIN 0.4 MG SL SUBL
SUBLINGUAL_TABLET | SUBLINGUAL | Status: AC
  Filled 2024-02-22: qty 2

## 2024-02-22 MED ORDER — NITROGLYCERIN 0.4 MG SL SUBL
0.8000 mg | SUBLINGUAL_TABLET | Freq: Once | SUBLINGUAL | Status: AC
  Administered 2024-02-22: 0.8 mg via SUBLINGUAL

## 2024-02-22 MED ORDER — IOHEXOL 350 MG/ML SOLN
100.0000 mL | Freq: Once | INTRAVENOUS | Status: AC | PRN
  Administered 2024-02-22: 100 mL via INTRAVENOUS

## 2024-03-07 ENCOUNTER — Ambulatory Visit

## 2024-03-11 NOTE — Progress Notes (Unsigned)
 Cardiology Office Note:    Date:  03/14/2024   ID:  Sapir Lavey, DOB [birth year], MRN [id number]  PCP:  Vernon Velna SAUNDERS, MD   [facility] Providers Cardiologist:  None     Referring MD: Vernon Velna SAUNDERS, MD   Chief Complaint  Patient presents with   Chest Pain    History of Present Illness:    Valerie Chung is a 73 y.o. female is seen at the request of Dr Cottie for evaluation of chest pain. She has a history of HTN, HLD, and sleep apnea- not on CPAP. She had evaluation in 2009 with normal chest CT, Echo and LE venous doppler. Myoview at that time showed possible apical ischemia vs artifact. She has had prior stress tests at [facility]- last a Myoview study in 2019 which was normal.   She reports that for the past 5 months she has noted increase in chest pain. It is left parasternal toward her left breast. Usually dull but sometimes is quite sharp and painful. Not clearly exertional. Has taken Ntg with some but not complete relief.   We performed coronary CTA which showed minimal coronary plaque and calcium  score of 0.   Past Medical History:  Diagnosis Date   Arthritis    Back pain    low   Complication of anesthesia    slow to wake up   Hyperlipemia    Hypertension    Sleep apnea     Past Surgical History:  Procedure Laterality Date   BACK SURGERY     BREAST EXCISIONAL BIOPSY Right    TOTAL KNEE ARTHROPLASTY Right 01/12/2023   Procedure: TOTAL KNEE ARTHROPLASTY;  Surgeon: Ernie Cough, MD;  Location: [facility];  Service: Orthopedics;  Laterality: Right;    Current Medications: Current Meds  Medication Sig   aspirin  81 MG chewable tablet Chew 81 mg by mouth daily.   atorvastatin  (LIPITOR) 40 MG tablet Take 40 mg by mouth daily.   metoprolol  succinate (TOPROL -XL) 25 MG 24 hr tablet Take 25 mg by mouth daily.   nitroGLYCERIN  (NITROSTAT ) 0.4 MG SL tablet Place 1 tablet (0.4 mg total) under the tongue every 5 (five) minutes as needed for chest pain.      Allergies:   Tramadol  and Pneumococcal 13-val conj vacc   Social History   Socioeconomic History   Marital status: Married    Spouse name: Not on file   Number of children: 4   Years of education: Not on file   Highest education level: Not on file  Occupational History   Not on file  Tobacco Use   Smoking status: Former   Smokeless tobacco: Never  Vaping Use   Vaping status: Never Used  Substance and Sexual Activity   Alcohol use: Yes    Comment: occasional   Drug use: Never   Sexual activity: Not on file  Other Topics Concern   Not on file  Social History Narrative   Not on file   Social Drivers of Health   Financial Resource Strain: Not on file  Food Insecurity: No Food Insecurity (01/12/2023)   Hunger Vital Sign    Worried About Running Out of Food in the Last Year: Never true    Ran Out of Food in the Last Year: Never true  Transportation Needs: No Transportation Needs (01/12/2023)   PRAPARE - Administrator, Civil Service (Medical): No    Lack of Transportation (Non-Medical): No  Physical Activity: Not on file  Stress:  Not on file  Social Connections: Not on file     Family History: The patient's family history includes Cancer in her mother; Cerebral aneurysm in her sister; Diabetes Mellitus II in her sister; Heart failure in her mother.  ROS:   Please see the history of present illness.     All other systems reviewed and are negative.  EKGs/Labs/Other Studies Reviewed:    The following studies were reviewed today:    Coronary CTA 02/22/24: IMPRESSION: 1. Total coronary calcium  score of 0.   2. Total plaque volume (TPV) is 18 mm3, categorized as mild, which is 11th percentile for age- and sex-matched controls.   3. Normal coronary origins with right dominance.   4. CAD-RADS 1  Minimal non-obstructive CAD.   5. Minimal noncalcified plaque (< 25%) proximal segment of OM2.   6. Aortic atherosclerosis.   RECOMMENDATION: Consider  non-atherosclerotic causes of chest pain. Consider preventive therapy and risk factor modification.       Recent Labs: 11/26/2023: Hemoglobin 13.1; Platelets 270 02/02/2024: BUN 11; Creatinine, Ser 0.85; Potassium 3.4; Sodium 142  Recent Lipid Panel No results found for: CHOL, TRIG, HDL, CHOLHDL, VLDL, LDLCALC, LDLDIRECT  Dated 05/31/23: cholesterol 139, triglycerides 50, HDL 65, LDL 63.  Risk Assessment/Calculations:     Physical Exam:    VS:  BP (!) 144/80   Pulse 75   Ht 5' 9 (1.753 m)   Wt 249 lb (112.9 kg)   SpO2 97%   BMI 36.77 kg/m     Wt Readings from Last 3 Encounters:  03/14/24 249 lb (112.9 kg)  02/02/24 251 lb (113.9 kg)  11/26/23 251 lb (113.9 kg)     GEN:  Well nourished, well developed in no acute distress HEENT: Normal NECK: No JVD; No carotid bruits LYMPHATICS: No lymphadenopathy CARDIAC: RRR, no murmurs, rubs, gallops, minimal chest wall tenderness to palpation.  RESPIRATORY:  Clear to auscultation without rales, wheezing or rhonchi  ABDOMEN: Soft, non-tender, non-distended MUSCULOSKELETAL:  No edema; No deformity  SKIN: Warm and dry NEUROLOGIC:  Alert and oriented x 3 PSYCHIATRIC:  Normal affect   ASSESSMENT:    1. Precordial pain   2. Primary hypertension   3. Hypercholesteremia     PLAN:    In order of problems listed above:  Chest pain. Based on her coronary CTA I think we can safely say that her pain is not cardiac related. Can follow up with PCP. HTN. Controlled HLD on statin. LDL 63.            Medication Adjustments/Labs and Tests Ordered: Current medicines are reviewed at length with the patient today.  Concerns regarding medicines are outlined above.  No orders of the defined types were placed in this encounter.  No orders of the defined types were placed in this encounter.   There are no Patient Instructions on file for this visit.   Signed, Bently Morath [place], MD  03/14/2024 11:43 AM    [facility]

## 2024-03-14 ENCOUNTER — Encounter: Payer: Self-pay | Admitting: Cardiology

## 2024-03-14 ENCOUNTER — Other Ambulatory Visit: Payer: Self-pay | Admitting: Internal Medicine

## 2024-03-14 ENCOUNTER — Ambulatory Visit
Admission: RE | Admit: 2024-03-14 | Discharge: 2024-03-14 | Disposition: A | Discharge status: Home / Self Care | Source: Ambulatory Visit | Attending: Internal Medicine

## 2024-03-14 ENCOUNTER — Ambulatory Visit: Attending: Cardiology | Admitting: Cardiology

## 2024-03-14 VITALS — BP 144/80 | HR 75 | Ht 69.0 in | Wt 249.0 lb

## 2024-03-14 DIAGNOSIS — E7800 Pure hypercholesterolemia, unspecified: Secondary | ICD-10-CM

## 2024-03-14 DIAGNOSIS — I10 Essential (primary) hypertension: Secondary | ICD-10-CM | POA: Diagnosis not present

## 2024-03-14 DIAGNOSIS — N644 Mastodynia: Secondary | ICD-10-CM

## 2024-03-14 DIAGNOSIS — R072 Precordial pain: Secondary | ICD-10-CM | POA: Diagnosis not present

## 2024-03-14 DIAGNOSIS — Z1231 Encounter for screening mammogram for malignant neoplasm of breast: Secondary | ICD-10-CM

## 2024-03-14 NOTE — Patient Instructions (Signed)

## 2024-03-20 DIAGNOSIS — E7800 Pure hypercholesterolemia, unspecified: Secondary | ICD-10-CM | POA: Diagnosis not present

## 2024-03-20 DIAGNOSIS — M1711 Unilateral primary osteoarthritis, right knee: Secondary | ICD-10-CM | POA: Diagnosis not present

## 2024-03-20 DIAGNOSIS — H35031 Hypertensive retinopathy, right eye: Secondary | ICD-10-CM | POA: Diagnosis not present

## 2024-03-21 DIAGNOSIS — J988 Other specified respiratory disorders: Secondary | ICD-10-CM | POA: Diagnosis not present

## 2024-03-21 DIAGNOSIS — R0789 Other chest pain: Secondary | ICD-10-CM | POA: Diagnosis not present

## 2024-03-21 DIAGNOSIS — G4733 Obstructive sleep apnea (adult) (pediatric): Secondary | ICD-10-CM | POA: Diagnosis not present

## 2024-04-06 ENCOUNTER — Ambulatory Visit
Admission: RE | Admit: 2024-04-06 | Discharge: 2024-04-06 | Disposition: A | Discharge status: Home / Self Care | Source: Ambulatory Visit | Attending: Internal Medicine | Admitting: Internal Medicine

## 2024-04-06 ENCOUNTER — Ambulatory Visit
Admission: RE | Admit: 2024-04-06 | Discharge: 2024-04-06 | Disposition: A | Discharge status: Home / Self Care | Source: Ambulatory Visit | Attending: Internal Medicine

## 2024-04-06 DIAGNOSIS — N644 Mastodynia: Secondary | ICD-10-CM

## 2024-05-21 DIAGNOSIS — E7800 Pure hypercholesterolemia, unspecified: Secondary | ICD-10-CM | POA: Diagnosis not present

## 2024-05-21 DIAGNOSIS — M1711 Unilateral primary osteoarthritis, right knee: Secondary | ICD-10-CM | POA: Diagnosis not present

## 2024-05-21 DIAGNOSIS — H35031 Hypertensive retinopathy, right eye: Secondary | ICD-10-CM | POA: Diagnosis not present

## 2024-05-31 DIAGNOSIS — R7301 Impaired fasting glucose: Secondary | ICD-10-CM | POA: Diagnosis not present

## 2024-05-31 DIAGNOSIS — M5136 Other intervertebral disc degeneration, lumbar region with discogenic back pain only: Secondary | ICD-10-CM | POA: Diagnosis not present

## 2024-05-31 DIAGNOSIS — I700 Atherosclerosis of aorta: Secondary | ICD-10-CM | POA: Diagnosis not present

## 2024-05-31 DIAGNOSIS — E7800 Pure hypercholesterolemia, unspecified: Secondary | ICD-10-CM | POA: Diagnosis not present

## 2024-05-31 DIAGNOSIS — H35031 Hypertensive retinopathy, right eye: Secondary | ICD-10-CM | POA: Diagnosis not present

## 2024-05-31 DIAGNOSIS — I10 Essential (primary) hypertension: Secondary | ICD-10-CM | POA: Diagnosis not present

## 2024-05-31 DIAGNOSIS — Z0000 Encounter for general adult medical examination without abnormal findings: Secondary | ICD-10-CM | POA: Diagnosis not present

## 2024-05-31 DIAGNOSIS — G629 Polyneuropathy, unspecified: Secondary | ICD-10-CM | POA: Diagnosis not present

## 2024-05-31 DIAGNOSIS — G4733 Obstructive sleep apnea (adult) (pediatric): Secondary | ICD-10-CM | POA: Diagnosis not present

## 2024-06-20 DIAGNOSIS — E7800 Pure hypercholesterolemia, unspecified: Secondary | ICD-10-CM | POA: Diagnosis not present

## 2024-06-20 DIAGNOSIS — M1711 Unilateral primary osteoarthritis, right knee: Secondary | ICD-10-CM | POA: Diagnosis not present

## 2024-06-20 DIAGNOSIS — H35031 Hypertensive retinopathy, right eye: Secondary | ICD-10-CM | POA: Diagnosis not present
